# Patient Record
Sex: Female | Born: 1965
Health system: Southern US, Community
[De-identification: ages and names within clinical notes are randomized; demographics above are authoritative.]

## PROBLEM LIST (undated history)

## (undated) DIAGNOSIS — E119 Type 2 diabetes mellitus without complications: Secondary | ICD-10-CM

## (undated) DIAGNOSIS — J45909 Unspecified asthma, uncomplicated: Secondary | ICD-10-CM

## (undated) HISTORY — PX: COLONOSCOPY: SHX174

## (undated) HISTORY — PX: TONSILLECTOMY: SUR1361

## (undated) HISTORY — DX: Type 2 diabetes mellitus without complications: E11.9

## (undated) HISTORY — DX: Unspecified asthma, uncomplicated: J45.909

---

## 1999-04-17 ENCOUNTER — Other Ambulatory Visit: Admission: RE | Admit: 1999-04-17 | Discharge: 1999-04-17 | Payer: Self-pay | Admitting: Obstetrics and Gynecology

## 1999-04-23 ENCOUNTER — Other Ambulatory Visit: Admission: RE | Admit: 1999-04-23 | Discharge: 1999-04-23 | Payer: Self-pay | Admitting: Obstetrics and Gynecology

## 1999-04-23 ENCOUNTER — Encounter (INDEPENDENT_AMBULATORY_CARE_PROVIDER_SITE_OTHER): Payer: Self-pay

## 1999-06-11 ENCOUNTER — Encounter (INDEPENDENT_AMBULATORY_CARE_PROVIDER_SITE_OTHER): Payer: Self-pay | Admitting: Specialist

## 1999-06-11 ENCOUNTER — Ambulatory Visit (HOSPITAL_COMMUNITY): Admission: RE | Admit: 1999-06-11 | Discharge: 1999-06-11 | Payer: Self-pay | Admitting: Obstetrics and Gynecology

## 1999-06-11 HISTORY — PX: HYSTEROSCOPY WITH D & C: SHX1775

## 2000-10-07 ENCOUNTER — Other Ambulatory Visit: Admission: RE | Admit: 2000-10-07 | Discharge: 2000-10-07 | Payer: Self-pay | Admitting: Obstetrics and Gynecology

## 2001-06-08 ENCOUNTER — Encounter: Admission: RE | Admit: 2001-06-08 | Discharge: 2001-09-06 | Payer: Self-pay | Admitting: Family Medicine

## 2001-10-08 ENCOUNTER — Inpatient Hospital Stay (HOSPITAL_COMMUNITY): Admission: RE | Admit: 2001-10-08 | Discharge: 2001-10-10 | Payer: Self-pay | Admitting: Obstetrics and Gynecology

## 2001-10-08 ENCOUNTER — Encounter (INDEPENDENT_AMBULATORY_CARE_PROVIDER_SITE_OTHER): Payer: Self-pay | Admitting: *Deleted

## 2001-10-08 HISTORY — PX: TOTAL ABDOMINAL HYSTERECTOMY: SHX209

## 2003-10-13 ENCOUNTER — Other Ambulatory Visit: Admission: RE | Admit: 2003-10-13 | Discharge: 2003-10-13 | Payer: Self-pay | Admitting: *Deleted

## 2003-11-02 ENCOUNTER — Encounter: Admission: RE | Admit: 2003-11-02 | Discharge: 2003-11-02 | Payer: Self-pay | Admitting: Family Medicine

## 2005-01-23 ENCOUNTER — Ambulatory Visit (HOSPITAL_COMMUNITY): Admission: RE | Admit: 2005-01-23 | Discharge: 2005-01-23 | Payer: Self-pay | Admitting: *Deleted

## 2005-01-23 ENCOUNTER — Encounter (INDEPENDENT_AMBULATORY_CARE_PROVIDER_SITE_OTHER): Payer: Self-pay | Admitting: Specialist

## 2005-02-06 ENCOUNTER — Ambulatory Visit (HOSPITAL_COMMUNITY): Admission: RE | Admit: 2005-02-06 | Discharge: 2005-02-06 | Payer: Self-pay | Admitting: *Deleted

## 2005-02-06 ENCOUNTER — Encounter (INDEPENDENT_AMBULATORY_CARE_PROVIDER_SITE_OTHER): Payer: Self-pay | Admitting: Specialist

## 2006-11-18 ENCOUNTER — Other Ambulatory Visit: Admission: RE | Admit: 2006-11-18 | Discharge: 2006-11-18 | Payer: Self-pay | Admitting: Family Medicine

## 2008-08-17 ENCOUNTER — Other Ambulatory Visit: Admission: RE | Admit: 2008-08-17 | Discharge: 2008-08-17 | Payer: Self-pay | Admitting: Family Medicine

## 2008-09-05 ENCOUNTER — Ambulatory Visit (HOSPITAL_COMMUNITY): Admission: RE | Admit: 2008-09-05 | Discharge: 2008-09-05 | Payer: Self-pay | Admitting: Gastroenterology

## 2008-09-05 ENCOUNTER — Encounter (INDEPENDENT_AMBULATORY_CARE_PROVIDER_SITE_OTHER): Payer: Self-pay | Admitting: Gastroenterology

## 2010-01-21 ENCOUNTER — Other Ambulatory Visit: Admission: RE | Admit: 2010-01-21 | Discharge: 2010-01-21 | Payer: Self-pay | Admitting: Family Medicine

## 2010-09-03 NOTE — Op Note (Signed)
Priscilla Webb, Priscilla Webb               ACCOUNT NO.:  [id number]   MEDICAL RECORD NO.:  [id number]          PATIENT TYPE:  AMB   LOCATION:  ENDO                         FACILITY:  [facility]   PHYSICIAN:  Bernette Redbird, M.D.   DATE OF BIRTH:  Jan 24, 1966   DATE OF PROCEDURE:  09/05/2008  DATE OF DISCHARGE:                               OPERATIVE REPORT   PROCEDURE:  Colonoscopy with polypectomy and directed submucosal  injection.   INDICATION:  This is a 45 year old female who, around age 63, had a  massive sigmoid polyp resected by Dr. Roosvelt Harps.  The polyp was  benign, but adenomatous in character.  Somehow the patient was lost to  follow-up, but recently resurfaced at her primary physician's office  where she was noted to be Hemoccult positive and, in the meantime, her  father had been found to have colon cancer.   FINDINGS:  Large colon polyp removed piecemeal from the region of the  rectosigmoid junction.   PROCEDURE:  The risks of the procedure have been reviewed with the  patient.  She came as an outpatient to the [facility] endoscopy unit.  Sedation was fentanyl 100 mcg and Versed 10 mg IV to a level of mild  sedation.   The Pentax adult video colonoscope was easily advanced around the colon  to the cecum as identified by visualization of the appendiceal orifice  and pullback was then performed.  The quality of prep was excellent and  it was felt that all areas were well seen.   On the way in, strands of mucoid blood were noted in the rectum, and I  came upon a very large, approximately 5- to 6-cm diameter, fleshy mobile  nonulcerated benign-appearing polyp which almost occupied the colonic  lumen.  It was on a fairly long, discrete stalk.  The stalk was injected  with several mLs of 1:10,000 epinephrine and the head of the polyp was  transected with the ERBE coagulator, using a large snare.  However,  after doing so, there was still a fair amount of residual polyp and,  indeed, I had to do approximately 4 piecemeal resections before it  appeared that all tissue had been successfully retrieved.  The polyp  tissue essentially filled up 1 of the pathology specimen jars.  There  was no bleeding from the polypectomy and although the cautery went  fairly deep, to the point where I could see some muscle fibers, there  was no evidence of perforation and the patient remained comfortable  throughout the procedure.  I then used the sclero needle to tattoo the  polyp neck at the polypectomy site on either side, and this was  successfully accomplished.   The remainder of the colonoscopy was normal.  No other polyps were seen  and there was no evidence of cancer, colitis, vascular ectasia or  diverticulosis.  Retroflexion:  The rectum was normal.   The patient tolerated the procedure well and there were no apparent  complications.   IMPRESSION:  Large, roughly 6-cm, pedunculated polyp at 20 cm, similar  in location to previous removal of a  massive colonic polyp about 4 years  ago, suggesting the possibility of local recurrence.  Successful  piecemeal removal with subsequent tattooing.   PLAN:  Await pathology on the polyp.  Sigmoidoscopic follow-up in about  3 months to confirm adequacy of excision.  Consider genetic testing.           ______________________________  Bernette Redbird, M.D.     RB/MEDQ  D:  09/05/2008  T:  09/05/2008  Job:  [id number]   cc:   Clovis Riley, FNP  [facility]  [facility]

## 2010-09-06 NOTE — Op Note (Signed)
[facility]  Patient:    Priscilla Webb, Priscilla Webb Visit Number: [id number] MRN: [id number]          Service Type: GYN Location: 4W 0472 01 Attending Physician:  Sharon Mt Dictated by:   Rande Brunt. Eda Paschal, M.D. Proc. Date: 10/08/01 Admit Date:  10/08/2001                             Operative Report  PREOPERATIVE DIAGNOSIS:  Refractory dysfunctional uterine bleeding.  POSTOPERATIVE DIAGNOSIS:  Refractory dysfunctional uterine bleeding.  OPERATION:  Total abdominal hysterectomy.  SURGEON:  Daniel L. Eda Paschal, M.D.  FIRST ASSISTANT:  Juan H. Lily Peer, M.D.  ANESTHESIA:  General endotracheal.  FINDINGS:  At the time of surgery, the patient had a boggy, slightly enlarged uterus.  Ovaries, fallopian tubes, and pelvic peritoneum were free of any disease.  DESCRIPTION OF PROCEDURE:  After adequate general endotracheal anesthesia, the patient was placed in a supine position and prepped and draped in the usual sterile manner.  A Foley catheter was inserted into the patients bladder.  A transverse incision was made.  It was made above her panniculus in order to prevent skin infection.  It was extended through the fascia transversely into the peritoneum vertically.  Subcutaneous tissue bleeders were clamped and Bovied as encountered.  When the peritoneal cavity was opened, in spite of an adequate skin incision, because of the patients obesity, it was not really possible to have enough visualization to do the surgery.  Therefore, the rectus muscles were separated with the Bovie.  The were not completely separated laterally, but they were separated enough medially to give enough retraction.  All bleeding was controlled.  At this point, retractor could be placed.  The pelvis was inspected.  The round ligaments were Bovied and cut. The vesicouterine fold to peritoneum and posterior peritoneum was sharply dissected free.  The uteroovarian ligaments and  fallopian tubes were clamped, cut, and doubly suture ligated with #1 chromic catgut, leaving both ovaries in situ.  The bladder flap was advanced by sharp dissection.  The uterine arteries were clamped, cut, and doubly suture ligated with #1 chromic catgut. The cervicovaginal junction was identified, and then the parametrium was taken down in successive bites by clamping, cutting, and suture ligating with #1 chromic catgut.  At this point, the vagina was entered, and the uterus was sent to pathology for tissue diagnosis.  Angle sutures were placed in the angles of the vagina incorporating cardinal ligaments and uterosacral ligaments for good vault support, and then the cuff was closed with figure-of-eights of 0 Vicryl.  Copious irrigation was done with Ringers Lactate.  Two sponge, needle, instrument counts were correct, and then the fascia was closed with two running 0 PDSs.  Scarpas fascia was brought together with interrupteds of 2-0 plain, and the skin was closed with staples. Estimated blood loss for the entire procedure was 550 cc with none replaced. The patient tolerated the procedure well and left the operating room in satisfactory condition draining clear urine from her Foley catheter. Dictated by:   Rande Brunt. Eda Paschal, M.D. Attending Physician:  Sharon Mt DD:  10/08/00 TD:  10/09/01 Job: [id number]

## 2010-09-06 NOTE — Op Note (Signed)
[facility]  Patient:    Priscilla Webb, Priscilla Webb                      MRN: [id number] Proc. Date: 06/11/99 Adm. Date:  [id number] Attending:  Sharon Mt                           Operative Report  PREOPERATIVE DIAGNOSIS:       Menometrorrhagia with intrauterine defect.  POSTOPERATIVE DIAGNOSIS:      Menometrorrhagia with intrauterine defect.  Small  endometrial polyp.  OPERATION:                    Hysteroscopy, excision of endometrial polyp, D&C.  SURGEON:                      Daniel L. Eda Paschal, M.D.  ASSISTANT:  ANESTHESIA:                   General anesthesia.  FINDINGS:                     External and vaginal examination is within normal  limits.  Cervix is clean.  Uterus is midposition, normal size and shape without  descensus.  Adnexa are palpably normal.  At the time of hysteroscopy, the patient had a small endometrial polyp coming off the posterior wall of the fundus.  It as approximately 10 x 6 mm.  After this was removed, top of the fundus, tubal ostia, anterior and posterior walls of the fundus, lower uterine segment, and endocervical canal could all be well visualized and all were normal.  ESTIMATED BLOOD LOSS:  DESCRIPTION OF PROCEDURE:     After adequate general endotracheal anesthesia, the patient was placed in the dorsal lithotomy position and prepped and draped in the usual sterile fashion.  A single tooth tenaculum was placed on the anterior lip of the cervix and then the cervix was dilated to a #35 Pratt dilator. Hysteroscopic examination was done with the hysteroscopic resectoscope.  3% Sorbitol was used to expand the intrauterine cavity.  A camera was used for magnification.  A 90 degree wire loupe was attached to the hysteroscope.  Bovie settings were 70 coag, 110 cutting, blend I.  The intrauterine cavity was well visualized and was as noted  above.  The endometrial polyp was excised with the  resectoscope without any bleeding whatsoever.  Following this other than a significant buildup of endometrium, the intrauterine cavity was normal.  A sharp curettage was done with two different size curets with significant curettings being removed.  They were  sent to pathology as was the polyp.  At the termination of the procedure, there was no significant bleeding.  Estimated blood loss for the entire procedure was less than 50 cc with none replaced.  Fluid deficit was under 100 cc.  The patient left the operating room in satisfactory condition. DD:  06/11/99 TD:  06/11/99 Job: [id number]

## 2010-09-06 NOTE — Discharge Summary (Signed)
[facility]  Patient:    Priscilla Webb, Priscilla Webb Visit Number: [id number] MRN: [id number]          Service Type: GYN Location: 4W 0472 01 Attending Physician:  Sharon Mt Dictated by:   Rande Brunt. Eda Paschal, M.D. Admit Date:  10/08/2001 Discharge Date: 10/10/2001                             Discharge Summary  HISTORY OF PRESENT ILLNESS:  The patient is a 45 year old female who was admitted to the hospital with refractory DUB and polycystic ovarian syndrome for definitive surgery.  HOSPITAL COURSE:  On the day of admission she was taken to the operating room and a total abdominal hysterectomy was performed.  Postoperatively, she did well, and by the second postoperative day she was ready for discharge.  DISCHARGE MEDICATIONS:  Tylox p.r.n.  DIET:  Regular.  ACTIVITY:  Ambulatory.  FOLLOWUP:  She will return to the office in two days for staple removal.  Final pathology report is not available at time of dictation.  DISCHARGE DIAGNOSES: 1. Refractory DUB. 2. Polycystic ovarian syndrome.  OPERATION:  Total abdominal hysterectomy.  CONDITION ON DISCHARGE:  Improved. Dictated by:   Rande Brunt. Eda Paschal, M.D. Attending Physician:  Sharon Mt DD:  10/10/01 TD:  10/11/01 Job: [id number]

## 2010-09-06 NOTE — H&P (Signed)
[facility]  Patient:    Priscilla Webb, Priscilla Webb Visit Number: [id number] MRN: [id number]          Service Type: GYN Location: 4W 0472 01 Attending Physician:  Sharon Mt Dictated by:   Rande Brunt. Eda Paschal, M.D. Admit Date:  10/08/2001 Discharge Date: 10/10/2001                           History and Physical  CHIEF COMPLAINT:  Menometrorrhagia not responsive to other therapy.  HISTORY OF PRESENT ILLNESS:  The patient is a 45 year old nulligravida who has had a long history of menometrorrhagia associated with polycystic ovarian syndrome.  At one point, she had an endometrial polyp.  Hysteroscopy and D&C were done with removal of the polyp, but the menometrorrhagia has persisted. It has been associated with anemia.  The patient has tried ovulation induction in the past without success in terms of conceiving.  She has been on a variety of oral contraceptives, including 50 mcg estrogen, without any resolution of the above.  She has tried Megace with any resolution of the above.  Although at one point she was interested in having a family, this persistent menometrorrhagia with anemia has taken its toll in her life and she really now wants to resolve it.  She understands that she will not be able to conceive after hysterectomy, but would like to proceed with total abdominal hysterectomy.  She has given me permission to remove one or both ovaries for significant disease.  PAST MEDICAL HISTORY: 1. T&A in 1984. 2. D&C and hysteroscopy in 2001.  PRESENT MEDICATIONS:  Megace.  ALLERGIES:  PENICILLIN.  FAMILY HISTORY:  Her grandmother is diabetic.  Her father is hypertensive. Her father also has had coronary artery disease.  Her mother grandmother has had breast cancer.  Her paternal grandmother also had non-Hodgkins lymphoma.  SOCIAL HISTORY:  She is a nonsmoker and nondrinker.  She does have two cups of caffeine per day.  REVIEW OF SYSTEMS:   HEENT:  Negative.  Cardiac:  Negative.  GI:  Negative. GU:  Negative.  Neuromuscular:  Negative.  Endocrine:  Negative.  Allergic, Immunologic, Lymphatic, and Endocrine:  Negative.  PHYSICAL EXAMINATION:  The patient is a well-developed, well-nourished female in no acute distress.  VITAL SIGNS:  Her blood pressure is 120/88, pulse 80 and regular, and respirations 16 and nonlabored.  She is afebrile.  HEENT:  Within normal limits.  NECK:  Supple.  Trachea in the midline.  The thyroid is not enlarged.  LUNGS:  Clear to P&A.  HEART:  No thrills, heaves, or murmurs.  BREASTS:  No masses.  ABDOMEN:  Soft without guarding, rebound, or masses.  PELVIC:  External and vaginal is within normal limits.  The cervix is clear. The Pap smear shows no atypia.  The uterus is normal in size and shape. Adnexa are palpably normal.  Rectovaginal is confirmatory.  ADMISSION IMPRESSION:  Refractory dysfunctional uterine bleeding.  PLAN:  TAH. Dictated by:   Rande Brunt. Eda Paschal, M.D. Attending Physician:  Sharon Mt DD:  10/07/01 TD:  10/08/01 Job: [id number]

## 2011-03-20 ENCOUNTER — Other Ambulatory Visit: Payer: Self-pay | Admitting: Family Medicine

## 2011-03-20 DIAGNOSIS — E049 Nontoxic goiter, unspecified: Secondary | ICD-10-CM

## 2011-03-24 ENCOUNTER — Ambulatory Visit
Admission: RE | Admit: 2011-03-24 | Discharge: 2011-03-24 | Disposition: A | Discharge status: Home / Self Care | Payer: 59 | Source: Ambulatory Visit | Attending: Family Medicine | Admitting: Family Medicine

## 2011-03-24 DIAGNOSIS — E049 Nontoxic goiter, unspecified: Secondary | ICD-10-CM

## 2011-08-25 ENCOUNTER — Encounter: Payer: Self-pay | Admitting: *Deleted

## 2011-08-25 ENCOUNTER — Encounter: Payer: 59 | Attending: Family Medicine | Admitting: *Deleted

## 2011-08-25 DIAGNOSIS — E663 Overweight: Secondary | ICD-10-CM | POA: Insufficient documentation

## 2011-08-25 DIAGNOSIS — Z713 Dietary counseling and surveillance: Secondary | ICD-10-CM | POA: Insufficient documentation

## 2011-08-25 NOTE — Patient Instructions (Addendum)
Plan: Aim for 3 Carb Choices per meal (45 grams) +/- 1 either way, 0-1 per snack if hungry Ask MD about possibility of using Invokana, new DM medication associated with weight loss Consider choosing more types of fats from plant sources, fewer from animal sources which are saturated Continue with water activities 2-4 times a week Read food labels for Total Carb and Fat grams

## 2011-08-25 NOTE — Progress Notes (Signed)
  Medical Nutrition Therapy:  Appt start time: 1100 end time:  1200.  Assessment:  Primary concerns today: patient here for more information on PCOS and assistance with weight loss to improve insulin resistance. She lives with her husband and her parents as her Dad has cancer so she can assist with his care. She works at [employer] on weekend shift 7AM - 7 PM and one day during week every other week.    MEDICATIONS: see list, no Rx meds at this time   DIETARY INTAKE:  Usual eating pattern includes 2-3 meals and 3 snacks per day.  Everyday foods include good variety of all food groups.  Avoided foods include none stated.    24-hr recall:  B ( AM): skips 1-2 days a week, banana, OR left overs in refrig, OR cereal with 2% milk  Snk ( AM): left overs occasionally  L ( PM): left overs from home OR at home, fruit juice with Splenda Snk ( PM): 6 pk PNB crackers, water or coffee black D ( PM): Dad prepares: burgers, pasta, or meat - starch - veg meal, water or regular soda 12 oz Snk ( PM): occasionally - cake at [employer] OR ice cream pop once a week Beverages: coffee, milk, regular soda, SF fruit juice  Usual physical activity: gym twice a week [employer], water Zumba, occasionally treadmill or weight training  Estimated energy needs: 1500 calories 170 g carbohydrates 112 g protein 42 g fat  Progress Towards Goal(s):  In progress.   Nutritional Diagnosis:  NI-1.5 Excessive energy intake As related to activity level.  As evidenced by BMI of 53.1% .    Intervention:  Nutrition counseling provided in addition to pre-diabetes information. concentrated on carb counting with assessment of fat intake as well. Taught reading food labels and encouraged her to continue with recent increase in activity level along with her husband Plan: Aim for 3 Carb Choices per meal (45 grams) +/- 1 either way, 0-1 per snack if hungry Ask MD about possibility of using Invokana, new DM medication associated  with weight loss Consider choosing more types of fats from plant sources, fewer from animal sources which are saturated Continue with water activities 2-4 times a week Read food labels for Total Carb and Fat grams   Handouts given during visit include: Living Well with Diabetes Carb Counting and Food Label handouts Meal Plan Card PCOS handout from [employer]  Monitoring/Evaluation:  Dietary intake, exercise, reading food labels, and body weight prn.

## 2011-08-26 ENCOUNTER — Other Ambulatory Visit: Payer: Self-pay | Admitting: Family Medicine

## 2011-08-26 ENCOUNTER — Other Ambulatory Visit (HOSPITAL_COMMUNITY)
Admission: RE | Admit: 2011-08-26 | Discharge: 2011-08-26 | Disposition: A | Discharge status: Home / Self Care | Payer: 59 | Source: Ambulatory Visit | Attending: Family Medicine | Admitting: Family Medicine

## 2011-08-26 DIAGNOSIS — Z124 Encounter for screening for malignant neoplasm of cervix: Secondary | ICD-10-CM | POA: Insufficient documentation

## 2011-08-26 DIAGNOSIS — Z1159 Encounter for screening for other viral diseases: Secondary | ICD-10-CM | POA: Insufficient documentation

## 2011-11-18 ENCOUNTER — Other Ambulatory Visit: Payer: Self-pay | Admitting: Family Medicine

## 2011-11-18 DIAGNOSIS — R10819 Abdominal tenderness, unspecified site: Secondary | ICD-10-CM

## 2011-11-24 ENCOUNTER — Other Ambulatory Visit: Payer: Self-pay | Admitting: Family Medicine

## 2011-11-24 ENCOUNTER — Other Ambulatory Visit: Payer: 59

## 2011-11-24 DIAGNOSIS — R109 Unspecified abdominal pain: Secondary | ICD-10-CM

## 2011-11-25 ENCOUNTER — Other Ambulatory Visit: Payer: 59

## 2011-11-25 ENCOUNTER — Ambulatory Visit
Admission: RE | Admit: 2011-11-25 | Discharge: 2011-11-25 | Disposition: A | Discharge status: Home / Self Care | Payer: 59 | Source: Ambulatory Visit | Attending: Family Medicine | Admitting: Family Medicine

## 2011-11-25 DIAGNOSIS — R109 Unspecified abdominal pain: Secondary | ICD-10-CM

## 2011-11-29 ENCOUNTER — Encounter (HOSPITAL_COMMUNITY): Payer: Self-pay | Admitting: *Deleted

## 2011-11-29 ENCOUNTER — Emergency Department (HOSPITAL_COMMUNITY)
Admission: EM | Admit: 2011-11-29 | Discharge: 2011-11-29 | Disposition: A | Discharge status: Home / Self Care | Payer: 59 | Attending: Emergency Medicine | Admitting: Emergency Medicine

## 2011-11-29 DIAGNOSIS — R109 Unspecified abdominal pain: Secondary | ICD-10-CM | POA: Insufficient documentation

## 2011-11-29 LAB — URINALYSIS, ROUTINE W REFLEX MICROSCOPIC
Ketones, ur: NEGATIVE mg/dL
Nitrite: NEGATIVE
Protein, ur: NEGATIVE mg/dL
Urobilinogen, UA: 0.2 mg/dL (ref 0.0–1.0)

## 2011-11-29 LAB — POCT I-STAT, CHEM 8
BUN: 13 mg/dL (ref 6–23)
Calcium, Ion: 1.18 mmol/L (ref 1.12–1.23)
Chloride: 105 mEq/L (ref 96–112)
Hemoglobin: 15 g/dL (ref 12.0–15.0)

## 2011-11-29 LAB — COMPREHENSIVE METABOLIC PANEL
ALT: 26 U/L (ref 0–35)
BUN: 13 mg/dL (ref 6–23)
CO2: 28 mEq/L (ref 19–32)
Creatinine, Ser: 0.65 mg/dL (ref 0.50–1.10)
GFR calc Af Amer: >90 mL/min (ref >90)
GFR calc non Af Amer: >90 mL/min (ref >90)

## 2011-11-29 LAB — CBC
Hemoglobin: 14.4 g/dL (ref 12.0–15.0)
MCV: 90.1 fL (ref 78.0–100.0)
Platelets: 273 10*3/uL (ref 150–400)

## 2011-11-29 MED ORDER — HYDROCODONE-ACETAMINOPHEN 5-325 MG PO TABS: ORAL_TABLET | ORAL | Status: AC

## 2011-11-29 NOTE — ED Notes (Signed)
Pt states that she has been having right side pain close to the umbilicus. Pt saw PCP on Tuesday and had an ultrasound done. Pt denies problems with urination or bowel or discharge. Pt states she feels like she has been having increased gas.

## 2011-11-29 NOTE — ED Notes (Signed)
Patient states she has kidney stones, she states she thinks that the pain was more anxiety.

## 2011-11-29 NOTE — ED Provider Notes (Signed)
History     CSN: [id number]  Arrival date & time 11/29/11  0146   First MD Initiated Contact with Patient 11/29/11 [phone]      Chief Complaint  Patient presents with  . Flank Pain    (Consider location/radiation/quality/duration/timing/severity/associated sxs/prior treatment) HPI Comments: Patient with history of abdominal hysterectomy -- presents with two-week history of intermittent right lower abdominal pain in the area of a knot that she can feel. Patient saw her primary care physician for this this week and had abdominal ultrasound performed which showed fatty infiltration of the liver but no other acute findings. Patient states that the pain is sharp and fleeting. It does not radiate. It is made worse with palpation of the area. Certain positions make it worse and certain positions make it better. She denies fever, nausea or vomiting. She states that she has been mildly constipated since the pain began however she has not had any blood in her stool. No urinary symptoms. Onset was acute. Course is intermittent. No other abdominal surgeries.   Patient is a 46 y.o. female presenting with abdominal pain. The history is provided by the patient.  Abdominal Pain The primary symptoms of the illness include abdominal pain. The primary symptoms of the illness do not include fever, shortness of breath, nausea, vomiting, diarrhea, dysuria, vaginal discharge or vaginal bleeding. The current episode started more than 2 days ago. The onset of the illness was sudden. The problem has not changed since onset. Additional symptoms associated with the illness include constipation.    History reviewed. No pertinent past medical history.  Past Surgical History  Procedure Date  . Abdominal hysterectomy   . Tonsillectomy     History reviewed. No pertinent family history.  History  Substance Use Topics  . Smoking status: Never Smoker   . Smokeless tobacco: Never Used  . Alcohol Use: No     wine once  a month or so    OB History    Grav Para Term Preterm Abortions TAB SAB Ect Mult Living                  Review of Systems  Constitutional: Negative for fever.  HENT: Negative for sore throat and rhinorrhea.   Eyes: Negative for redness.  Respiratory: Negative for cough and shortness of breath.   Cardiovascular: Negative for chest pain.  Gastrointestinal: Positive for abdominal pain and constipation. Negative for nausea, vomiting, diarrhea and blood in stool.  Genitourinary: Negative for dysuria, vaginal bleeding and vaginal discharge.  Musculoskeletal: Negative for myalgias.  Skin: Negative for rash.  Neurological: Negative for headaches.    Allergies  Bee venom; Doxycycline; and Penicillins  Home Medications   Current Outpatient Rx  Name Route Sig Dispense Refill  . IBUPROFEN 100 MG PO TABS Oral Take 100 mg by mouth every 6 (six) hours as needed.    . MULTI-VITAMIN/MINERALS PO TABS Oral Take 1 tablet by mouth daily.      BP 183/109  Pulse 110  Temp 98.8 F (37.1 C) (Oral)  Resp 16  SpO2 96%  Physical Exam  Nursing note and vitals reviewed. Constitutional: She appears well-developed and well-nourished.  HENT:  Head: Normocephalic and atraumatic.  Eyes: Conjunctivae are normal. Right eye exhibits no discharge. Left eye exhibits no discharge.  Neck: Normal range of motion. Neck supple.  Cardiovascular: Normal rate, regular rhythm and normal heart sounds.        No tachycardia on my exam.   Pulmonary/Chest: Effort normal and breath  sounds normal.  Abdominal: Soft. Bowel sounds are normal. There is tenderness. There is no rebound and no guarding.       Obese abdomen. Localized tenderness over small firm area of abdominal wall, R lateral abdomen in fat crease. No skin changes.   Neurological: She is alert.  Skin: Skin is warm and dry.  Psychiatric: She has a normal mood and affect.    ED Course  Procedures (including critical care time)  Labs Reviewed    COMPREHENSIVE METABOLIC PANEL - Abnormal; Notable for the following:    Glucose, Bld 144 (*)     All other components within normal limits  POCT I-STAT, CHEM 8 - Abnormal; Notable for the following:    Glucose, Bld 140 (*)     All other components within normal limits  CBC  URINALYSIS, ROUTINE W REFLEX MICROSCOPIC  LAB REPORT - SCANNED   No results found.   1. Abdominal pain     3:00 AM Patient seen and examined. Work-up initiated. Patient does not want any pain medications.   Vital signs reviewed and are as follows: Filed Vitals:   11/29/11 0154  BP: 183/109  Pulse: 110  Temp: 98.8 F (37.1 C)  Resp: 16   Work-up completed and is reassuring. Patient is reassured. She will follow-up with Dr. Zachery Dauer for continued evaluation and management.   The patient was urged to return to the Emergency Department immediately with worsening of current symptoms, worsening abdominal pain, persistent vomiting, blood noted in stools, fever, or any other concerns. The patient verbalized understanding.   Patient counseled on use of narcotic pain medications. Counseled not to combine these medications with others containing tylenol. Urged not to drink alcohol, drive, or perform any other activities that requires focus while taking these medications. The patient verbalizes understanding and agrees with the plan.  BP 164/109  Pulse 110  Temp 98.8 F (37.1 C) (Oral)  Resp 17  SpO2 96%  MDM  Patient with abdominal pain over 'knot' in abdominal wall. Work-up reassuring. Recent abd [place] reviewed and negative except for fatty liver. I am uncertain as of etiology of knot. Do not suspect abscess or hernia. Possible scar tissue with irregular bordered. Do not think it is a lipoma. Regardless, patient appears well. No other symptoms including fever, N/V/D, constipation. She is appropriate for follow-up with PCP with symptoms control in meantime.   HTN noted -- no obvious end-organ damage.   Mild  tachycardia -- do not suspect sepsis or that this represents SIRS.       Renne Crigler, [place] 12/01/11 2017

## 2011-12-05 NOTE — ED Provider Notes (Signed)
Medical screening examination/treatment/procedure(s) were performed by non-physician practitioner and as supervising physician I was immediately available for consultation/collaboration.  Sunnie Nielsen, MD 12/05/11 [phone]

## 2012-03-31 ENCOUNTER — Encounter (INDEPENDENT_AMBULATORY_CARE_PROVIDER_SITE_OTHER): Payer: Self-pay | Admitting: General Surgery

## 2012-03-31 ENCOUNTER — Ambulatory Visit (INDEPENDENT_AMBULATORY_CARE_PROVIDER_SITE_OTHER): Payer: Commercial Managed Care - PPO | Admitting: General Surgery

## 2012-03-31 VITALS — BP 132/70 | HR 76 | Temp 97.0°F | Resp 12 | Ht 60.5 in | Wt 281.6 lb

## 2012-03-31 DIAGNOSIS — L729 Follicular cyst of the skin and subcutaneous tissue, unspecified: Secondary | ICD-10-CM

## 2012-03-31 DIAGNOSIS — L723 Sebaceous cyst: Secondary | ICD-10-CM

## 2012-03-31 NOTE — Progress Notes (Signed)
Patient ID: Priscilla Webb, female   DOB: 03/27/1966, 46 y.o.   MRN: [id number]  Chief Complaint  Patient presents with  . New Evaluation    eval (2) abd wall abscess - RLQ - draining since 9/13    HPI Priscilla Webb is a 46 y.o. female.   HPI 46 yo WF referred by Dr Zachery Dauer for evaluation of persistent abdominal wall abscess/cysts. The patient states that she was in her usual state of health until this past June when she noticed some swelling on her right lower abdominal wall. She states that it got to the size of a softball. She states that it is red, swollen, and tender. It was initially thought to be a hernia. But later found to be an abscess. She underwent incision and drainage by her primary care physician. She was placed on an antibiotic course for this. She states that a month or 2 later she noticed a similar lesion on her left lower abdominal wall but not the same size or degree. It started spontaneously draining clear fluid. She states that the lesion on the right when it was lanced drained mainly bloody clear fluid. She denies any fevers or chills. She has been off and on Keflex several times. She denies any weight loss. She denies any trauma to the area. She has had a prior hysterectomy many years ago. She denies any other lumps or bumps. The one on the right is no longer draining any fluid. The one on the left occasionally will drain a little bit of clear fluid. Past Medical History  Diagnosis Date  . Asthma     Past Surgical History  Procedure Date  . Abdominal hysterectomy   . Tonsillectomy     Family History  Problem Relation Age of Onset  . Cancer Father     colon  . Cancer Maternal Grandmother     breast  . Cancer Paternal Grandmother     non hodgkins lymphoma    Social History History  Substance Use Topics  . Smoking status: Never Smoker   . Smokeless tobacco: Never Used  . Alcohol Use: No     Comment: wine once a month or so    Allergies  Allergen  Reactions  . Bee Venom   . Doxycycline Nausea And Vomiting  . Penicillins Rash    Current Outpatient Prescriptions  Medication Sig Dispense Refill  . cephALEXin (KEFLEX) 500 MG capsule Take 500 mg by mouth 3 (three) times daily.      Marland Kitchen ibuprofen (ADVIL,MOTRIN) 200 MG tablet Take 200-400 mg by mouth every 6 (six) hours as needed. headache      . Multiple Vitamin (MULTIVITAMIN WITH MINERALS) TABS Take 1 tablet by mouth daily.        Review of Systems Review of Systems  Constitutional: Negative for fever, activity change, appetite change and unexpected weight change.  HENT: Negative for hearing loss and neck pain.   Eyes: Negative for photophobia and visual disturbance.  Respiratory: Negative for chest tightness, shortness of breath and wheezing.   Cardiovascular: Negative for chest pain, palpitations and leg swelling.  Gastrointestinal: Negative for nausea, vomiting, diarrhea, constipation and blood in stool.  Genitourinary: Negative for dysuria and difficulty urinating.  Skin: Positive for wound. Negative for pallor and rash.  Neurological: Negative for seizures, light-headedness and numbness.  Hematological: Negative for adenopathy. Does not bruise/bleed easily.  Psychiatric/Behavioral: Negative for behavioral problems and confusion.    Blood pressure 132/70, pulse 76, temperature 97 F (  36.1 C), temperature source Temporal, resp. rate 12, height 5' 0.5" (1.537 m), weight 281 lb 9.6 oz (127.733 kg).  Physical Exam Physical Exam  Vitals reviewed. Constitutional: She is oriented to person, place, and time. She appears well-developed and well-nourished. No distress.       Morbidly obese  HENT:  Head: Normocephalic and atraumatic.  Right Ear: External ear normal.  Left Ear: External ear normal.  Eyes: Conjunctivae normal are normal. No scleral icterus.  Neck: Normal range of motion. Neck supple. No tracheal deviation present.  Cardiovascular: Normal rate, regular rhythm and  normal heart sounds.   Pulmonary/Chest: Effort normal and breath sounds normal. No respiratory distress. She has no wheezes.  Abdominal: Soft. Bowel sounds are normal.       Obese abdomen, mid-abdominal pannus. The 2 lesions are in fold of pannus. 1 - Rt side. Old incision almost healed. No cellulitis, induration, or fluctuance. No palpable mass. In vicinity there is some hyperpigmentation of skin; 2 - LLQ wound about 0.5 inch. No cellulitis, induration, or fluctuance  Neurological: She is alert and oriented to person, place, and time. She exhibits normal muscle tone.  Skin: Skin is warm and dry. She is not diaphoretic.  Psychiatric: She has a normal mood and affect. Her behavior is normal. Judgment and thought content normal.    Data Reviewed Dr Zachery Dauer note 12/4 ED note  Assessment    Abdominal wall cyst x 2    Plan    These appear to be healing abdominal wall abscesses. There does not appear to be any current signs of infection. There is no cellulitis, induration, or fluctuance. The etiology is probably due to friction between her skin folds in her pannus. This doesn't appear consistent with hydradenitis. I recommended observation for now. I do not believe she needs any additional antibiotics. I told her to contact the office should these areas flare up again  Priscilla Sella. Andrey Campanile, MD, FACS General, Bariatric, & Minimally Invasive Surgery [facility], [place]        [facility] 03/31/2012, 9:40 AM

## 2012-06-09 ENCOUNTER — Encounter (INDEPENDENT_AMBULATORY_CARE_PROVIDER_SITE_OTHER): Payer: Commercial Managed Care - PPO | Admitting: General Surgery

## 2013-08-29 ENCOUNTER — Other Ambulatory Visit: Payer: Self-pay | Admitting: Gastroenterology

## 2013-10-01 IMAGING — US US ABDOMEN COMPLETE
1 series · 14 of 25 positions shown · non-contrast
Comparison: None.

CLINICAL DATA: Right mid abdominal pain, obesity

ABDOMINAL ULTRASOUND COMPLETE

[Series 1: us abdomen complete · 0.41mm/px · 14 of 85 slices shown]
[im 1/85]
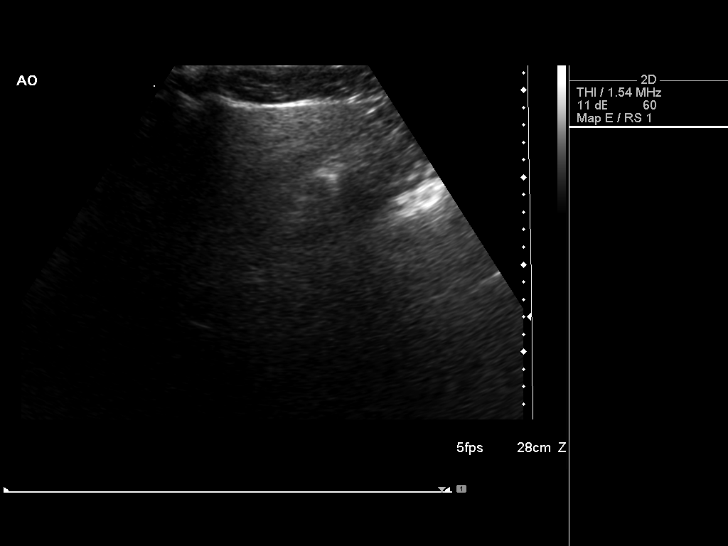
[im 8/85]
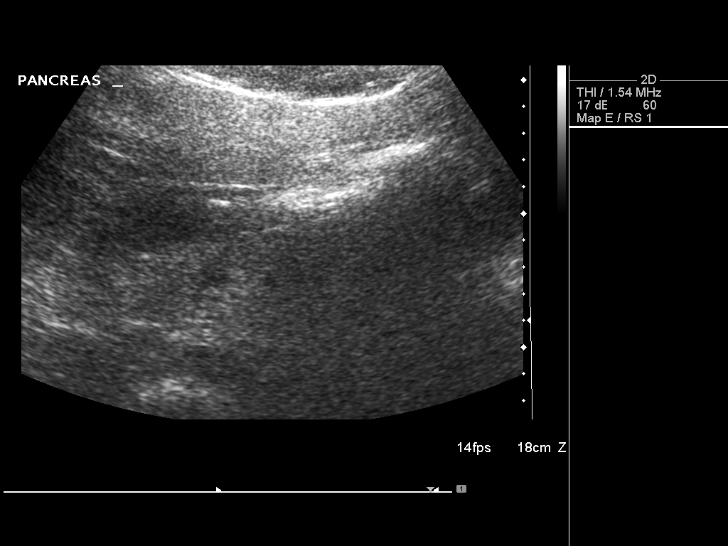
[im 15/85]
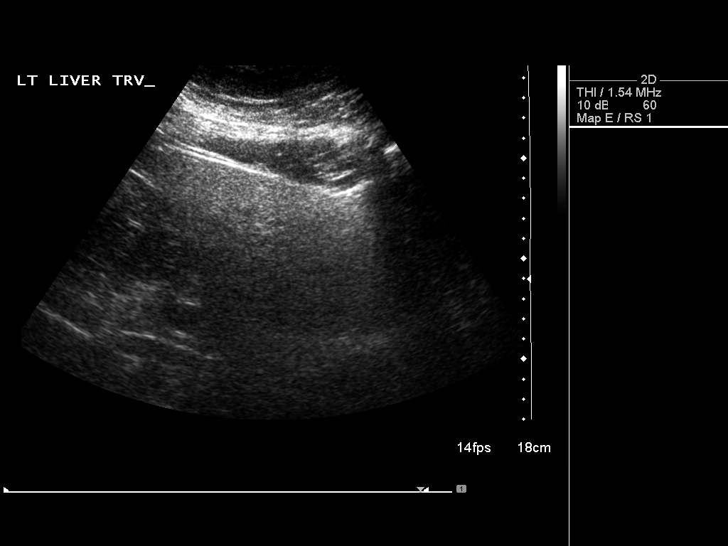
[im 22/85]
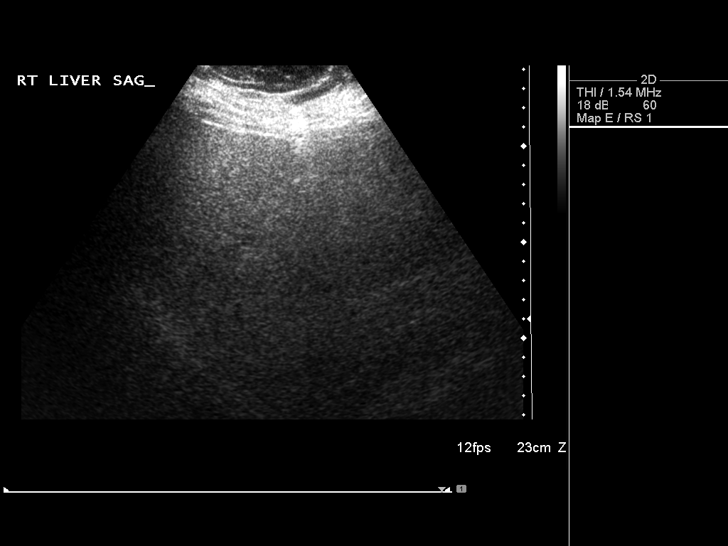
[im 29/85]
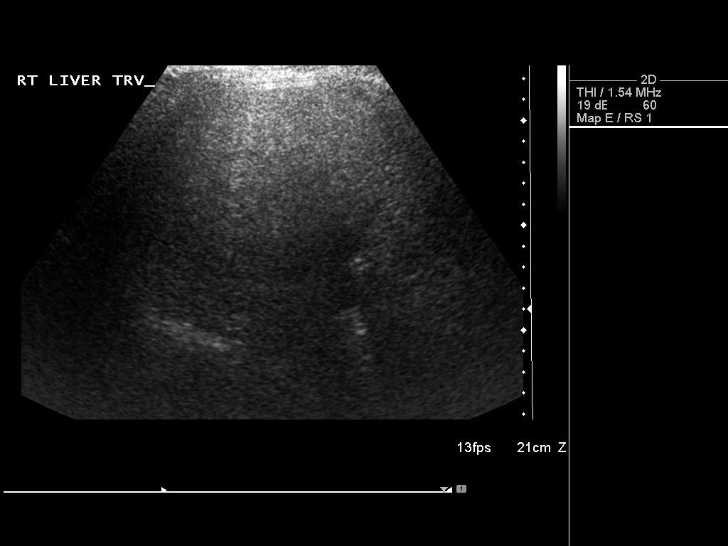
[im 32/85]
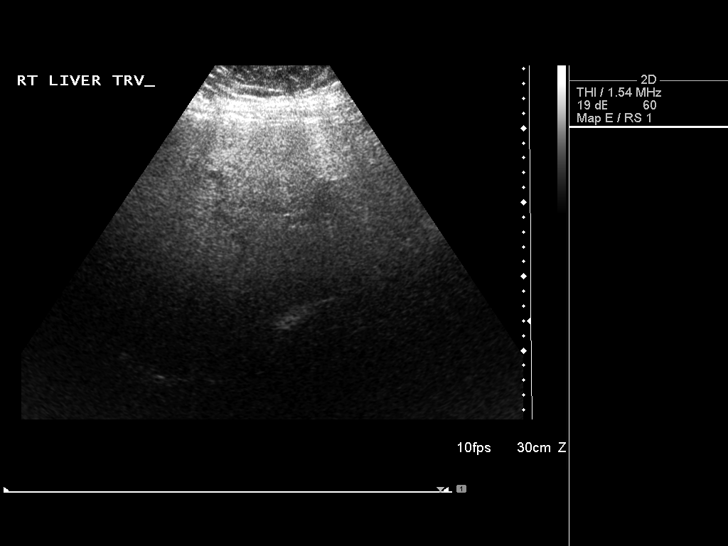
[im 39/85]
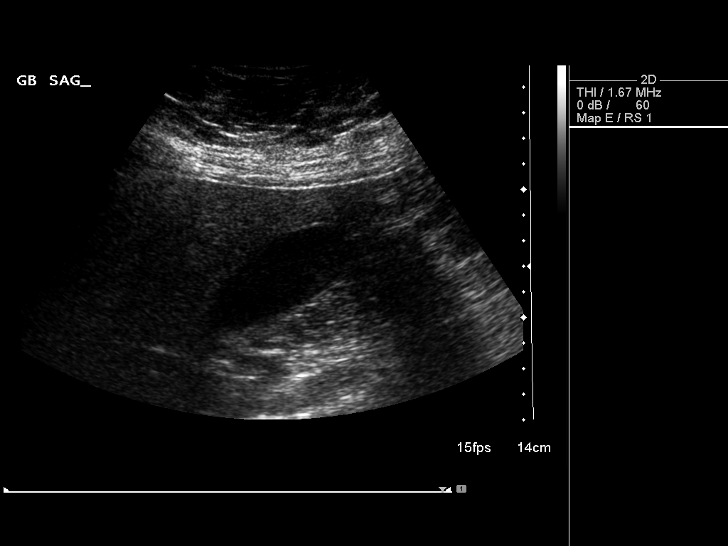
[im 46/85]
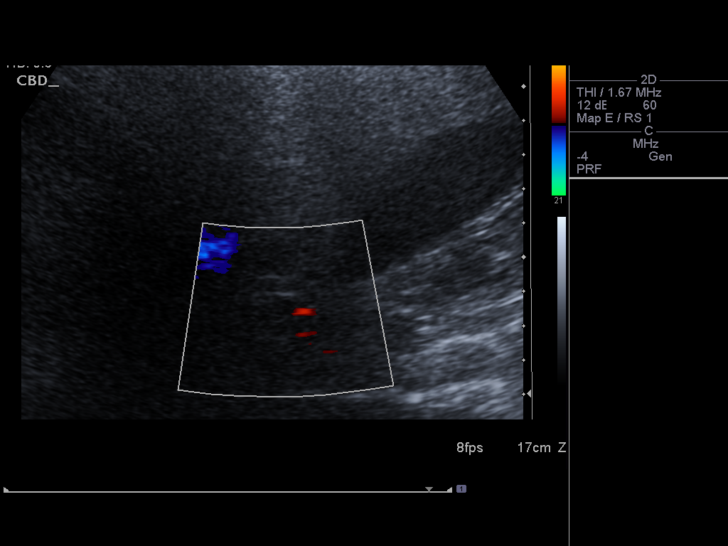
[im 53/85]
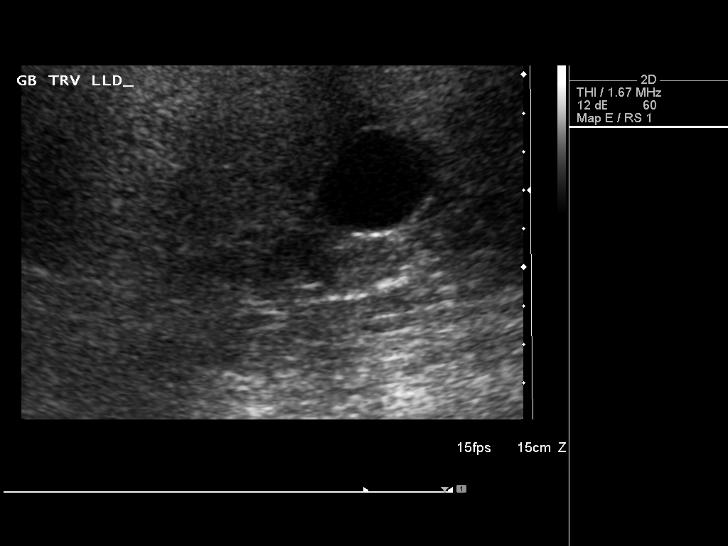
[im 57/85]
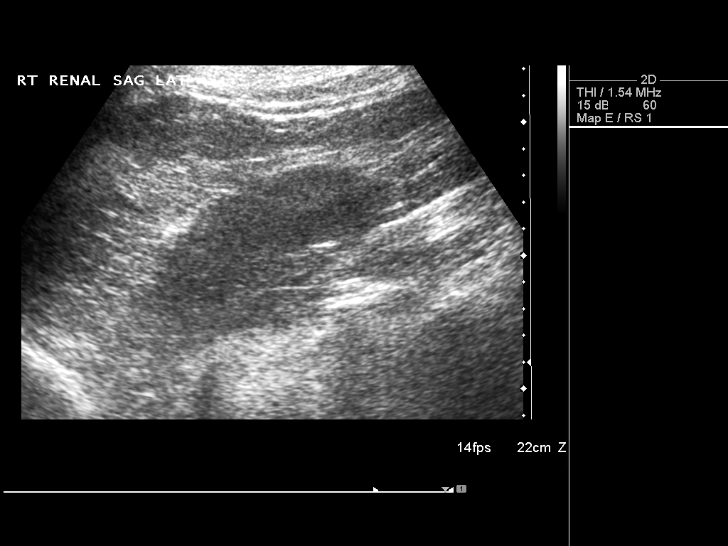
[im 64/85]
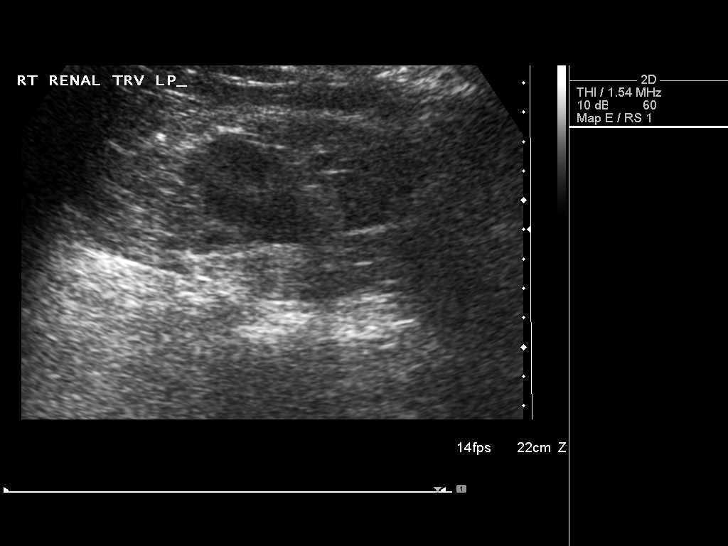
[im 71/85]
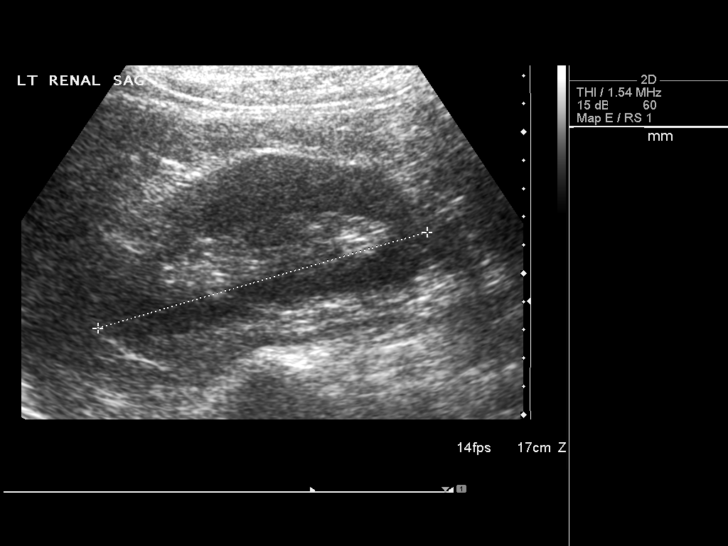
[im 78/85]
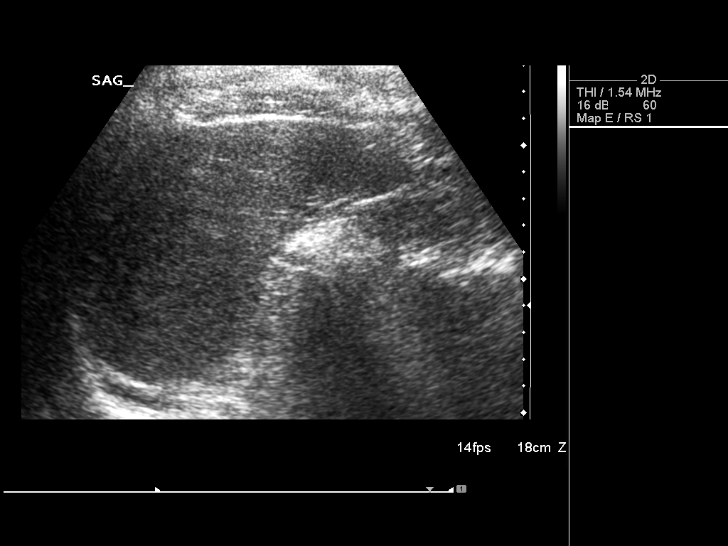
[im 85/85]
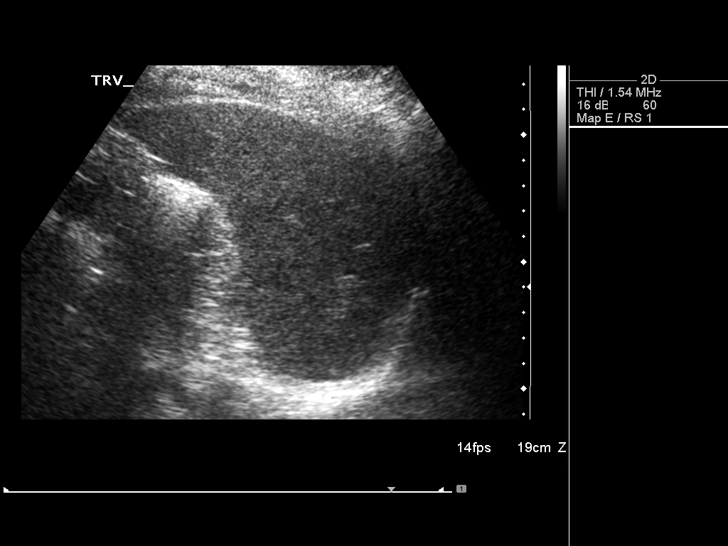

[14 of 25 positions shown; findings below may reference images not displayed]

FINDINGS: Gallbladder:  No gallstones, gallbladder wall thickening, or
pericholecystic fluid. Negative sonographic Murphy's sign.

Common Bile Duct:  Within normal limits in caliber. Measures 4 mm.

Liver: No focal mass lesion identified. There is pronounced fatty
infiltration.

IVC:  Appears normal.

Pancreas:  No abnormality identified.

Spleen: The upper limits of normal in size, measuring 11.5 cm in
length and 570 ml in volume. Within normal limits in echotexture.

Right kidney:  Normal in size and parenchymal echogenicity.  No
evidence of mass or hydronephrosis.

Left kidney:  Normal in size and parenchymal echogenicity.  No
evidence of mass or hydronephrosis.

Abdominal Aorta:  No aneurysm identified.
IMPRESSION: There is pronounced, diffuse fatty infiltration of the
liver.

## 2013-10-31 ENCOUNTER — Encounter (INDEPENDENT_AMBULATORY_CARE_PROVIDER_SITE_OTHER): Payer: Self-pay | Admitting: General Surgery

## 2013-10-31 ENCOUNTER — Ambulatory Visit (INDEPENDENT_AMBULATORY_CARE_PROVIDER_SITE_OTHER): Payer: Commercial Managed Care - PPO | Admitting: General Surgery

## 2013-10-31 VITALS — BP 130/74 | HR 77 | Temp 97.9°F | Ht 61.0 in | Wt 283.0 lb

## 2013-10-31 DIAGNOSIS — L02419 Cutaneous abscess of limb, unspecified: Secondary | ICD-10-CM

## 2013-10-31 DIAGNOSIS — L03119 Cellulitis of unspecified part of limb: Secondary | ICD-10-CM

## 2013-10-31 NOTE — Progress Notes (Signed)
Patient ID: Priscilla Webb, female   DOB: Mar 29, 1966, 48 y.o.   MRN: [id number]  Chief Complaint  Patient presents with  . eval cyst thigh    HPI Priscilla Webb is a 48 y.o. female.  Referred by Dr Yaakov Guthrie HPI This is a 48 year old female who presented in March 2 Dr. Sabra Heck with a cyst in the upper inner thigh.  At that point she had this area lanced. This expressed bloody stained cloudy purulent fluid. This was packed at that point. She was placed on antibiotics and then was followed. The area got better initially. For now this area keeps recurring and it drains occasionally. She denies any fevers ever. She is now started her first round of antibiotics after she saw Dr. Jacelyn Grip this morning. She complaints of continued drainage at this site but really has no other complaints.   Past Medical History  Diagnosis Date  . Asthma     Past Surgical History  Procedure Laterality Date  . Abdominal hysterectomy    . Tonsillectomy      Family History  Problem Relation Age of Onset  . Cancer Father     colon  . Cancer Maternal Grandmother     breast  . Cancer Paternal Grandmother     non hodgkins lymphoma    Social History History  Substance Use Topics  . Smoking status: Never Smoker   . Smokeless tobacco: Never Used  . Alcohol Use: No     Comment: wine once a month or so    Allergies  Allergen Reactions  . Bee Venom   . Doxycycline Nausea And Vomiting  . Penicillins Rash    Current Outpatient Prescriptions  Medication Sig Dispense Refill  . ibuprofen (ADVIL,MOTRIN) 200 MG tablet Take 200-400 mg by mouth every 6 (six) hours as needed. headache      . Multiple Vitamin (MULTIVITAMIN WITH MINERALS) TABS Take 1 tablet by mouth daily.      Marland Kitchen sulfamethoxazole-trimethoprim (BACTRIM DS) 800-160 MG per tablet Take 1 tablet by mouth 2 (two) times daily.       No current facility-administered medications for this visit.    Review of Systems Review of Systems  Constitutional:  Negative for fever, chills and unexpected weight change.  HENT: Negative for congestion, hearing loss, sore throat, trouble swallowing and voice change.   Eyes: Negative for visual disturbance.  Respiratory: Negative for cough and wheezing.   Cardiovascular: Negative for chest pain, palpitations and leg swelling.  Gastrointestinal: Negative for nausea, vomiting, abdominal pain, diarrhea, constipation, blood in stool, abdominal distention and anal bleeding.  Genitourinary: Negative for hematuria, vaginal bleeding and difficulty urinating.  Musculoskeletal: Negative for arthralgias.  Skin: Negative for rash and wound.  Neurological: Negative for seizures, syncope and headaches.  Hematological: Negative for adenopathy. Does not bruise/bleed easily.  Psychiatric/Behavioral: Negative for confusion.    Blood pressure 130/74, pulse 77, temperature 97.9 F (36.6 C), height 5\' 1"  (1.549 m), weight 283 lb (128.368 kg).  Physical Exam Physical Exam  Constitutional: She appears well-developed and well-nourished.  Musculoskeletal:       Legs:   Data Reviewed Notes from [facility]  Assessment    Chronic left inner thigh draining wound, ? abscess     Plan    I don't really identify an area excised or removed today. There is some drainage is coming out. There is some bruising associated with this also. I do think he would be the best thing to obtain an ultrasound of this  area to make sure there is not an underlying collection or another lesion prior to taking her to the operating room. She's going to continue her antibiotics will see me in the next couple of weeks again. She knows to call me sooner if this area gets worse.        Heavin Sebree 10/31/2013, 4:08 PM

## 2013-11-01 ENCOUNTER — Ambulatory Visit
Admission: RE | Admit: 2013-11-01 | Discharge: 2013-11-01 | Disposition: A | Discharge status: Home / Self Care | Payer: 59 | Source: Ambulatory Visit | Attending: General Surgery | Admitting: General Surgery

## 2013-11-01 DIAGNOSIS — L02419 Cutaneous abscess of limb, unspecified: Secondary | ICD-10-CM

## 2013-11-03 ENCOUNTER — Telehealth (INDEPENDENT_AMBULATORY_CARE_PROVIDER_SITE_OTHER): Payer: Self-pay

## 2013-11-03 DIAGNOSIS — R600 Localized edema: Secondary | ICD-10-CM

## 2013-11-03 DIAGNOSIS — T8189XS Other complications of procedures, not elsewhere classified, sequela: Secondary | ICD-10-CM

## 2013-11-03 NOTE — Telephone Encounter (Signed)
Pt returned call. I gave her the u/s results of the lower extremity showing a superficial fluid collection but radiologist recommending a mri of soft tissue structures. I wanted to explain that I placed the MRI order with [facility] and when trying to schedule the MRI [facility] advised me to check with the pt first since she was a [employer] employee she might get a better deal having the MRI done at [facility] w/the insurance. [facility] requires a copay of $500 up front with every MRI that comes thru the door and not sure if [facility] requires that much money up front. I explained to the pt that hospital's MRI machines are a little smaller so if she is claustrophobic at all the pt would really need to have done at [place] since their machines are more open. The pt would like for me to schedule at [facility]. I advised pt that I would turn the orders into scheduling for them to schedule and call her with the appt. I will call with results.

## 2013-11-03 NOTE — Telephone Encounter (Signed)
LMOM on cell and LM with someone at the home for pt to call me. I want to give her u/s results and talk to her about ordering another test.

## 2013-11-03 NOTE — Telephone Encounter (Signed)
Message copied by Illene Regulus on Thu Nov 03, 2013 10:54 AM ------      Message from: Donne Hazel, MATTHEW      Created: Wed Nov 02, 2013  3:23 PM       See below.  I think she should get mri            ----- Message -----         From: Rad Results In Interface         Sent: 11/01/2013   3:32 PM           To: Rolm Bookbinder, MD                   ------

## 2013-11-04 ENCOUNTER — Telehealth (INDEPENDENT_AMBULATORY_CARE_PROVIDER_SITE_OTHER): Payer: Self-pay | Admitting: *Deleted

## 2013-11-04 NOTE — Telephone Encounter (Signed)
LMOM for pt regarding her MRI of [facility].  Advise pt Elvina Sidle had the soonest appt and it is scheduled for 11-17-13 arriving at 8:00 a.m  Anderson Malta

## 2013-11-04 NOTE — Telephone Encounter (Signed)
Pt returned my call and she is aware of her appt for her MRI.  Anderson Malta

## 2013-11-17 ENCOUNTER — Ambulatory Visit (HOSPITAL_COMMUNITY): Payer: 59

## 2013-11-23 NOTE — Telephone Encounter (Signed)
LMOM for pt to call me back. Dr Donne Hazel is telling me now that the pt doesn't have to get the MRI that the pt can just be scheduled an appt to see him to discuss having this area excised. I see the pt is scheduled for the MRI on 8/11.

## 2013-11-24 NOTE — Telephone Encounter (Signed)
Pt returned call and spoke to [facility] in triage. Priscilla Webb gave the pt the message below that we could cx the MRI now per Dr Donne Hazel and just make an appt to come to the office to discuss having this area removed per Dr Donne Hazel. Appt made for 9/8. Pt cx'd the MRI.

## 2013-11-29 ENCOUNTER — Ambulatory Visit (HOSPITAL_COMMUNITY): Payer: 59

## 2013-12-27 ENCOUNTER — Encounter (INDEPENDENT_AMBULATORY_CARE_PROVIDER_SITE_OTHER): Payer: Commercial Managed Care - PPO | Admitting: General Surgery

## 2014-01-05 ENCOUNTER — Other Ambulatory Visit: Payer: Self-pay | Admitting: Family Medicine

## 2014-01-05 ENCOUNTER — Ambulatory Visit
Admission: RE | Admit: 2014-01-05 | Discharge: 2014-01-05 | Disposition: A | Discharge status: Home / Self Care | Payer: 59 | Source: Ambulatory Visit | Attending: Family Medicine | Admitting: Family Medicine

## 2014-01-05 DIAGNOSIS — R52 Pain, unspecified: Secondary | ICD-10-CM

## 2014-01-26 ENCOUNTER — Encounter (HOSPITAL_BASED_OUTPATIENT_CLINIC_OR_DEPARTMENT_OTHER): Payer: Self-pay | Admitting: *Deleted

## 2014-01-27 ENCOUNTER — Encounter (HOSPITAL_BASED_OUTPATIENT_CLINIC_OR_DEPARTMENT_OTHER): Payer: Self-pay | Admitting: *Deleted

## 2014-01-27 NOTE — Progress Notes (Signed)
To come in for labs

## 2014-01-30 ENCOUNTER — Encounter (HOSPITAL_BASED_OUTPATIENT_CLINIC_OR_DEPARTMENT_OTHER)
Admission: RE | Admit: 2014-01-30 | Discharge: 2014-01-30 | Disposition: A | Discharge status: Home / Self Care | Payer: 59 | Source: Ambulatory Visit | Attending: General Surgery | Admitting: General Surgery

## 2014-01-30 DIAGNOSIS — L720 Epidermal cyst: Secondary | ICD-10-CM | POA: Diagnosis not present

## 2014-01-30 DIAGNOSIS — Z91030 Bee allergy status: Secondary | ICD-10-CM | POA: Diagnosis not present

## 2014-01-30 DIAGNOSIS — Z880 Allergy status to penicillin: Secondary | ICD-10-CM | POA: Diagnosis not present

## 2014-01-30 DIAGNOSIS — L723 Sebaceous cyst: Secondary | ICD-10-CM | POA: Diagnosis present

## 2014-01-30 LAB — BASIC METABOLIC PANEL
Anion gap: 15 (ref 5–15)
BUN: 12 mg/dL (ref 6–23)
CALCIUM: 8.9 mg/dL (ref 8.4–10.5)
CO2: 26 mEq/L (ref 19–32)
CREATININE: 0.59 mg/dL (ref 0.50–1.10)
Chloride: 97 mEq/L (ref 96–112)
GFR calc Af Amer: >90 mL/min (ref >90)
GLUCOSE: 148 mg/dL — AB (ref 70–99)
Potassium: 4.2 mEq/L (ref 3.7–5.3)
SODIUM: 138 meq/L (ref 137–147)

## 2014-01-30 LAB — DIFFERENTIAL
Basophils Absolute: 0.1 10*3/uL (ref 0.0–0.1)
Basophils Relative: 1 % (ref 0–1)
EOS ABS: 0.3 10*3/uL (ref 0.0–0.7)
EOS PCT: 4 % (ref 0–5)
Lymphocytes Relative: 33 % (ref 12–46)
Lymphs Abs: 2.4 10*3/uL (ref 0.7–4.0)
Monocytes Absolute: 0.4 10*3/uL (ref 0.1–1.0)
Monocytes Relative: 6 % (ref 3–12)
Neutro Abs: 4.1 10*3/uL (ref 1.7–7.7)
Neutrophils Relative %: 56 % (ref 43–77)

## 2014-01-30 LAB — CBC
HCT: 42.3 % (ref 36.0–46.0)
Hemoglobin: 13.7 g/dL (ref 12.0–15.0)
MCH: 29.8 pg (ref 26.0–34.0)
MCHC: 32.4 g/dL (ref 30.0–36.0)
MCV: 92 fL (ref 78.0–100.0)
PLATELETS: 252 10*3/uL (ref 150–400)
RBC: 4.6 MIL/uL (ref 3.87–5.11)
RDW: 13.8 % (ref 11.5–15.5)
WBC: 7.3 10*3/uL (ref 4.0–10.5)

## 2014-01-30 NOTE — Progress Notes (Signed)
Pt seen and evaluated by Dr Al Corpus. Pt ok for surgery 02/02/14

## 2014-02-02 ENCOUNTER — Encounter (HOSPITAL_BASED_OUTPATIENT_CLINIC_OR_DEPARTMENT_OTHER): Payer: 59 | Admitting: Anesthesiology

## 2014-02-02 ENCOUNTER — Encounter (HOSPITAL_BASED_OUTPATIENT_CLINIC_OR_DEPARTMENT_OTHER): Payer: Self-pay | Admitting: *Deleted

## 2014-02-02 ENCOUNTER — Ambulatory Visit (HOSPITAL_BASED_OUTPATIENT_CLINIC_OR_DEPARTMENT_OTHER): Payer: 59 | Admitting: Anesthesiology

## 2014-02-02 ENCOUNTER — Ambulatory Visit (HOSPITAL_BASED_OUTPATIENT_CLINIC_OR_DEPARTMENT_OTHER)
Admission: RE | Admit: 2014-02-02 | Discharge: 2014-02-02 | Disposition: A | Discharge status: Home / Self Care | Payer: 59 | Source: Ambulatory Visit | Attending: General Surgery | Admitting: General Surgery

## 2014-02-02 ENCOUNTER — Encounter (HOSPITAL_BASED_OUTPATIENT_CLINIC_OR_DEPARTMENT_OTHER)
Admission: RE | Disposition: A | Discharge status: Home / Self Care | Payer: Self-pay | Source: Ambulatory Visit | Attending: General Surgery

## 2014-02-02 DIAGNOSIS — Z880 Allergy status to penicillin: Secondary | ICD-10-CM | POA: Insufficient documentation

## 2014-02-02 DIAGNOSIS — L720 Epidermal cyst: Secondary | ICD-10-CM | POA: Diagnosis not present

## 2014-02-02 DIAGNOSIS — Z91030 Bee allergy status: Secondary | ICD-10-CM | POA: Insufficient documentation

## 2014-02-02 HISTORY — PX: EAR CYST EXCISION: SHX22

## 2014-02-02 LAB — POCT HEMOGLOBIN-HEMACUE: HEMOGLOBIN: 15 g/dL (ref 12.0–15.0)

## 2014-02-02 SURGERY — CYST REMOVAL
Anesthesia: General | Site: Thigh | Laterality: Left

## 2014-02-02 MED ORDER — FENTANYL CITRATE 0.05 MG/ML IJ SOLN: 50.0000 ug | INTRAMUSCULAR | Status: DC | PRN

## 2014-02-02 MED ORDER — FENTANYL CITRATE 0.05 MG/ML IJ SOLN
INTRAMUSCULAR | Status: DC | PRN
  Administered 2014-02-02: 100 ug via INTRAVENOUS

## 2014-02-02 MED ORDER — MIDAZOLAM HCL 2 MG/2ML IJ SOLN: 1.0000 mg | INTRAMUSCULAR | Status: DC | PRN

## 2014-02-02 MED ORDER — DEXAMETHASONE SODIUM PHOSPHATE 4 MG/ML IJ SOLN
INTRAMUSCULAR | Status: DC | PRN
  Administered 2014-02-02: 10 mg via INTRAVENOUS

## 2014-02-02 MED ORDER — BUPIVACAINE HCL (PF) 0.25 % IJ SOLN
INTRAMUSCULAR | Status: AC
  Filled 2014-02-02: qty 90

## 2014-02-02 MED ORDER — OXYCODONE HCL 5 MG PO TABS
5.0000 mg | ORAL_TABLET | Freq: Once | ORAL | Status: AC | PRN
  Administered 2014-02-02: 5 mg via ORAL

## 2014-02-02 MED ORDER — CIPROFLOXACIN IN D5W 400 MG/200ML IV SOLN
400.0000 mg | INTRAVENOUS | Status: AC
  Administered 2014-02-02: 400 mg via INTRAVENOUS

## 2014-02-02 MED ORDER — LIDOCAINE HCL (CARDIAC) 20 MG/ML IV SOLN
INTRAVENOUS | Status: DC | PRN
  Administered 2014-02-02: 100 mg via INTRAVENOUS

## 2014-02-02 MED ORDER — CIPROFLOXACIN IN D5W 400 MG/200ML IV SOLN
INTRAVENOUS | Status: AC
  Filled 2014-02-02: qty 200

## 2014-02-02 MED ORDER — HYDROCODONE-ACETAMINOPHEN 5-325 MG PO TABS: 1.0000 | ORAL_TABLET | ORAL | Status: DC | PRN

## 2014-02-02 MED ORDER — OXYCODONE HCL 5 MG PO TABS
ORAL_TABLET | ORAL | Status: AC
  Filled 2014-02-02: qty 1

## 2014-02-02 MED ORDER — LACTATED RINGERS IV SOLN
INTRAVENOUS | Status: DC
  Administered 2014-02-02 (×2): via INTRAVENOUS

## 2014-02-02 MED ORDER — FENTANYL CITRATE 0.05 MG/ML IJ SOLN
25.0000 ug | INTRAMUSCULAR | Status: DC | PRN
  Administered 2014-02-02 (×2): 25 ug via INTRAVENOUS

## 2014-02-02 MED ORDER — OXYCODONE HCL 5 MG/5ML PO SOLN: 5.0000 mg | Freq: Once | ORAL | Status: AC | PRN

## 2014-02-02 MED ORDER — PROPOFOL 10 MG/ML IV BOLUS
INTRAVENOUS | Status: DC | PRN
  Administered 2014-02-02: 180 mg via INTRAVENOUS

## 2014-02-02 MED ORDER — PROMETHAZINE HCL 25 MG/ML IJ SOLN: 6.2500 mg | INTRAMUSCULAR | Status: DC | PRN

## 2014-02-02 MED ORDER — ONDANSETRON HCL 4 MG/2ML IJ SOLN
INTRAMUSCULAR | Status: DC | PRN
Start: 2014-02-02 — End: 2014-02-02
  Administered 2014-02-02: 4 mg via INTRAVENOUS

## 2014-02-02 MED ORDER — FENTANYL CITRATE 0.05 MG/ML IJ SOLN
INTRAMUSCULAR | Status: AC
  Filled 2014-02-02: qty 6

## 2014-02-02 MED ORDER — BUPIVACAINE HCL (PF) 0.25 % IJ SOLN
INTRAMUSCULAR | Status: DC | PRN
  Administered 2014-02-02: 10 mL

## 2014-02-02 MED ORDER — FENTANYL CITRATE 0.05 MG/ML IJ SOLN
INTRAMUSCULAR | Status: AC
  Filled 2014-02-02: qty 2

## 2014-02-02 MED ORDER — MIDAZOLAM HCL 5 MG/5ML IJ SOLN
INTRAMUSCULAR | Status: DC | PRN
  Administered 2014-02-02: 2 mg via INTRAVENOUS

## 2014-02-02 MED ORDER — MIDAZOLAM HCL 2 MG/2ML IJ SOLN
INTRAMUSCULAR | Status: AC
  Filled 2014-02-02: qty 2

## 2014-02-02 SURGICAL SUPPLY — 48 items
ADH SKN CLS APL DERMABOND .7 (GAUZE/BANDAGES/DRESSINGS) ×1
BLADE CLIPPER SURG (BLADE) IMPLANT
BLADE SURG 15 STRL LF DISP TIS (BLADE) ×1 IMPLANT
BLADE SURG 15 STRL SS (BLADE) ×2
CANISTER SUCT 1200ML W/VALVE (MISCELLANEOUS) IMPLANT
CHLORAPREP W/TINT 26ML (MISCELLANEOUS) ×2 IMPLANT
COVER BACK TABLE 60X90IN (DRAPES) ×2 IMPLANT
COVER MAYO STAND STRL (DRAPES) ×2 IMPLANT
DECANTER SPIKE VIAL GLASS SM (MISCELLANEOUS) IMPLANT
DERMABOND ADVANCED (GAUZE/BANDAGES/DRESSINGS) ×1
DERMABOND ADVANCED .7 DNX12 (GAUZE/BANDAGES/DRESSINGS) ×1 IMPLANT
DRAPE PED LAPAROTOMY (DRAPES) ×2 IMPLANT
DRSG TEGADERM 4X4.75 (GAUZE/BANDAGES/DRESSINGS) ×1 IMPLANT
ELECT COATED BLADE 2.86 ST (ELECTRODE) ×2 IMPLANT
ELECT REM PT RETURN 9FT ADLT (ELECTROSURGICAL) ×2
ELECTRODE REM PT RTRN 9FT ADLT (ELECTROSURGICAL) ×1 IMPLANT
GAUZE PACKING IODOFORM 1/4X15 (GAUZE/BANDAGES/DRESSINGS) IMPLANT
GLOVE BIO SURGEON STRL SZ7 (GLOVE) ×4 IMPLANT
GLOVE BIOGEL PI IND STRL 7.0 (GLOVE) IMPLANT
GLOVE BIOGEL PI IND STRL 7.5 (GLOVE) ×1 IMPLANT
GLOVE BIOGEL PI INDICATOR 7.0 (GLOVE) ×1
GLOVE BIOGEL PI INDICATOR 7.5 (GLOVE) ×1
GOWN STRL REUS W/ TWL LRG LVL3 (GOWN DISPOSABLE) ×3 IMPLANT
GOWN STRL REUS W/TWL LRG LVL3 (GOWN DISPOSABLE) ×4
NDL HYPO 25X1 1.5 SAFETY (NEEDLE) ×1 IMPLANT
NEEDLE HYPO 25X1 1.5 SAFETY (NEEDLE) ×2 IMPLANT
NS IRRIG 1000ML POUR BTL (IV SOLUTION) IMPLANT
PACK BASIN DAY SURGERY FS (CUSTOM PROCEDURE TRAY) ×2 IMPLANT
PENCIL BUTTON HOLSTER BLD 10FT (ELECTRODE) ×2 IMPLANT
SPONGE GAUZE 4X4 12PLY STER LF (GAUZE/BANDAGES/DRESSINGS) ×1 IMPLANT
STRIP CLOSURE SKIN 1/2X4 (GAUZE/BANDAGES/DRESSINGS) ×2 IMPLANT
SUT ETHILON 2 0 FS 18 (SUTURE) IMPLANT
SUT MNCRL AB 4-0 PS2 18 (SUTURE) ×2 IMPLANT
SUT SILK 2 0 SH (SUTURE) IMPLANT
SUT VIC AB 2-0 SH 27 (SUTURE)
SUT VIC AB 2-0 SH 27XBRD (SUTURE) IMPLANT
SUT VIC AB 3-0 SH 27 (SUTURE) ×4
SUT VIC AB 3-0 SH 27X BRD (SUTURE) ×2 IMPLANT
SUT VICRYL 3-0 CR8 SH (SUTURE) IMPLANT
SUT VICRYL 4-0 PS2 18IN ABS (SUTURE) IMPLANT
SWAB COLLECTION DEVICE MRSA (MISCELLANEOUS) IMPLANT
SYR CONTROL 10ML LL (SYRINGE) ×2 IMPLANT
TOWEL OR 17X24 6PK STRL BLUE (TOWEL DISPOSABLE) ×2 IMPLANT
TOWEL OR NON WOVEN STRL DISP B (DISPOSABLE) ×2 IMPLANT
TUBE ANAEROBIC SPECIMEN COL (MISCELLANEOUS) IMPLANT
TUBE CONNECTING 20X1/4 (TUBING) IMPLANT
UNDERPAD 30X30 INCONTINENT (UNDERPADS AND DIAPERS) ×2 IMPLANT
YANKAUER SUCT BULB TIP NO VENT (SUCTIONS) IMPLANT

## 2014-02-02 NOTE — Op Note (Signed)
Preoperative diagnosis: left inner thigh sebaceous cyst Postoperative diagnosis: same as above Procedure: excision of 2x2 cm subcutaneous left inner thigh sebaceous cyst Surgeon: Dr Serita Grammes EBL: minimal Comps: none Drains: none Pathology: specimen to pathology Anesthesia: Gen. Sponge and needle count correct to completion Disposition to recovery in stable condition  Indications: This is a 48 year old female who presents with a history of a draining left inner thigh lesion. We did an ultrasound that does show a mass associated with this as it was difficult to identify on the exam. We discussed excising this area due to continued drainage and episodes of infection.  Procedure: Both the patient and I identified the area prior to beginning. After informed consent was obtained she was then taken to the operating room. She was placed under anesthesia. She had sequential compression devices on her legs. She was given ciprofloxacin due to her allergies. She was then prepped and draped in the standard sterile surgical fashion. A surgical timeout was then performed.  I made an elliptical incision surrounding the area that went to the skin. I then used cautery to excise this area completely. There was no evidence of any further residual cyst and there was a mass present in my specimen. I then obtained hemostasis. I closed this with 3-0 Vicryl and 4-0 Monocryl. Dermabond and Steri-Strips are placed over this. She tolerated this well was extubated and transferred to the recovery room in stable condition.

## 2014-02-02 NOTE — Anesthesia Procedure Notes (Signed)
Procedure Name: LMA Insertion Date/Time: 02/02/2014 7:40 AM Performed by: Tawni Millers Pre-anesthesia Checklist: Patient identified, Emergency Drugs available, Suction available and Patient being monitored Patient Re-evaluated:Patient Re-evaluated prior to inductionOxygen Delivery Method: Circle System Utilized Preoxygenation: Pre-oxygenation with 100% oxygen Intubation Type: IV induction Ventilation: Mask ventilation without difficulty LMA: LMA inserted LMA Size: 4.0 Number of attempts: 1 Airway Equipment and Method: bite block Placement Confirmation: positive ETCO2 Tube secured with: Tape Dental Injury: Teeth and Oropharynx as per pre-operative assessment

## 2014-02-02 NOTE — Transfer of Care (Signed)
Immediate Anesthesia Transfer of Care Note  Patient: Priscilla Webb  Procedure(s) Performed: Procedure(s): LEFT THIGH SEBACEOUS CYST EXCISION 2.8 X 2 CM (Left)  Patient Location: PACU  Anesthesia Type:General  Level of Consciousness: awake and patient cooperative  Airway & Oxygen Therapy: Patient Spontanous Breathing and Patient connected to face mask oxygen  Post-op Assessment: Report given to PACU RN and Post -op Vital signs reviewed and stable  Post vital signs: Reviewed and stable  Complications: No apparent anesthesia complications

## 2014-02-02 NOTE — H&P (Signed)
The patient is a 48 year old female who presents with a complaint of Mass. 88 yof who I saw previously with cyst upper inner thigh that was vague. repeated infections of which she has had another in last month treated with abx. She is better now. Underwent US that shows a 2.8x1.1x2 cm complex superficial fluid collection in the left inner thigh.   Other Problems Alexander Bergeron [place], [place]; 12/27/2013 4:39 PM) Heart murmur  Past Surgical History ([place], [place]; 12/27/2013 4:39 PM) Colon Polyp Removal - Colonoscopy Hysterectomy (not due to cancer) - Complete Tonsillectomy  Diagnostic Studies History ([place], [place]; 12/27/2013 4:39 PM) Colonoscopy within last year Mammogram >3 years ago Pap Smear 1-5 years ago  Allergies ([place], [place]; 12/27/2013 4:22 PM) Wasp Stings Yellow Jacket Stings Penicillins Doxycycline *DERMATOLOGICALS*  Medication History ([place], RMA; 12/27/2013 4:39 PM) Ibuprofen (200MG  Capsule, Oral) Active. Multivitamin (Oral) Active. Medications Reconciled  Social History Alexander Bergeron [place], [place]; 12/27/2013 4:39 PM) Alcohol use Occasional alcohol use. Caffeine use Carbonated beverages, Coffee, Tea. No drug use Tobacco use Never smoker.  Family History Alexander Bergeron [place], [place]; 12/27/2013 4:39 PM) Alcohol Abuse Father, Mother. Arthritis Father, Mother. Breast Cancer Family Members In General. Cerebrovascular Accident Brother. Colon Cancer Father. Depression Father. Diabetes Mellitus Family Members In General. Heart Disease Father. Heart disease in female family member before age 43 Migraine Headache Brother. Rectal Cancer Father. Respiratory Condition Father. Seizure disorder Brother.  Pregnancy / Birth History Alexander Bergeron [place], [place]; 12/27/2013 4:39 PM) Age at menarche 78 years. Gravida 0 Irregular periods  Review of Systems ([place] RMA; 12/27/2013 4:39  PM) General Present- Fatigue. Not Present- Appetite Loss, Chills, Fever, Night Sweats, Weight Gain and Weight Loss. Skin Present- Non-Healing Wounds. Not Present- Change in Wart/Mole, Dryness, Hives, Jaundice, New Lesions, Rash and Ulcer. HEENT Not Present- Earache, Hearing Loss, Hoarseness, Nose Bleed, Oral Ulcers, Ringing in the Ears, Seasonal Allergies, Sinus Pain, Sore Throat, Visual Disturbances, Wears glasses/contact lenses and Yellow Eyes. Respiratory Not Present- Bloody sputum, Chronic Cough, Difficulty Breathing, Snoring and Wheezing. Breast Not Present- Breast Mass, Breast Pain, Nipple Discharge and Skin Changes. Cardiovascular Not Present- Chest Pain, Difficulty Breathing Lying Down, Leg Cramps, Palpitations, Rapid Heart Rate, Shortness of Breath and Swelling of Extremities. Gastrointestinal Not Present- Abdominal Pain, Bloating, Bloody Stool, Change in Bowel Habits, Chronic diarrhea, Constipation, Difficulty Swallowing, Excessive gas, Gets full quickly at meals, Hemorrhoids, Indigestion, Nausea, Rectal Pain and Vomiting. Female Genitourinary Not Present- Frequency, Nocturia, Painful Urination, Pelvic Pain and Urgency. Musculoskeletal Not Present- Back Pain, Joint Pain, Joint Stiffness, Muscle Pain, Muscle Weakness and Swelling of Extremities. Neurological Not Present- Decreased Memory, Fainting, Headaches, Numbness, Seizures, Tingling, Tremor, Trouble walking and Weakness. Psychiatric Not Present- Anxiety, Bipolar, Change in Sleep Pattern, Depression, Fearful and Frequent crying. Endocrine Not Present- Cold Intolerance, Excessive Hunger, Hair Changes, Heat Intolerance, Hot flashes and New Diabetes. Hematology Not Present- Easy Bruising, Excessive bleeding, Gland problems, HIV and Persistent Infections.   Vitals ([place]don[place] RMA; 12/27/2013 4:41 PM) 12/27/2013 4:39 PM Weight: 289 lb Height: 60in Body Surface Area: 2.36 m Body Mass Index: 56.44 kg/m Temp.: 98.8F(Oral)   Pulse: 101 (Regular)  BP: 134/80 (Sitting, Left Arm, Standard)    Physical Exam Rolm Bookbinder MD; 12/27/2013 4:52 PM) Musculoskeletal Note: left inner thigh with two holes overlying 2 cm area of thickened tissue, no real discrete mass CV: rrr Pulm clear bilaterally    Assessment & Plan Rolm Bookbinder MD; 12/27/2013 4:53 PM) SEBACEOUS CYST (706.2  L72.3) Impression: discussed ultrasound findings. will plan for  excision. risks include but not limited to bleeding, infection and recurrence

## 2014-02-02 NOTE — Anesthesia Preprocedure Evaluation (Addendum)
Anesthesia Evaluation  Patient identified by MRN, date of birth, ID band Patient awake    Reviewed: Allergy & Precautions, H&P , NPO status , Patient's Chart, lab work & pertinent test results  Airway Mallampati: III TM Distance: >3 FB Neck ROM: Full    Dental  (+) Teeth Intact, Dental Advisory Given   Pulmonary  breath sounds clear to auscultation        Cardiovascular negative cardio ROS  Rhythm:Regular Rate:Normal     Neuro/Psych negative neurological ROS  negative psych ROS   GI/Hepatic negative GI ROS, Neg liver ROS,   Endo/Other  Morbid obesity  Renal/GU negative Renal ROS     Musculoskeletal   Abdominal   Peds  Hematology negative hematology ROS (+)   Anesthesia Other Findings   Reproductive/Obstetrics                          Anesthesia Physical Anesthesia Plan  ASA: III  Anesthesia Plan: General   Post-op Pain Management:    Induction: Intravenous  Airway Management Planned: Oral ETT and LMA  Additional Equipment:   Intra-op Plan:   Post-operative Plan: Extubation in OR  Informed Consent: I have reviewed the patients History and Physical, chart, labs and discussed the procedure including the risks, benefits and alternatives for the proposed anesthesia with the patient or authorized representative who has indicated his/her understanding and acceptance.   Dental advisory given  Plan Discussed with: CRNA and Surgeon  Anesthesia Plan Comments:         Anesthesia Quick Evaluation

## 2014-02-02 NOTE — Discharge Instructions (Signed)
[facility]  POST OP INSTRUCTIONS  Always review your discharge instruction sheet given to you by the facility where your surgery was performed. IF YOU HAVE DISABILITY OR FAMILY LEAVE FORMS, YOU MUST BRING THEM TO THE OFFICE FOR PROCESSING.   DO NOT GIVE THEM TO YOUR DOCTOR.  1. A prescription for pain medication may be given to you upon discharge.  Take your pain medication as prescribed, if needed.  If narcotic pain medicine is not needed, then you may take acetaminophen (Tylenol), naprosyn (Alleve), or ibuprofen (Advil) as needed. 2. Take your usually prescribed medications unless otherwise directed. 3. If you need a refill on your pain medication, please contact your pharmacy.  They will contact our office to request authorization. Prescriptions will not be filled after 5pm or on week-ends. 4. You should follow a light diet the first few days after arrival home, such as soup and crackers, etc.  Be sure to include lots of fluids daily. 5. Most patients will experience some swelling and bruising in the area of the incision.  Ice packs will help.  Swelling and bruising can take several days to resolve.  6. It is common to experience some constipation if taking pain medication after surgery.  Increasing fluid intake and taking a stool softener (such as Colace) will usually help or prevent this problem from occurring.  A mild laxative (Milk of Magnesia or Miralax) should be taken according to package instructions if there are no bowel movements after 48 hours. 7. Unless discharge instructions indicate otherwise, you may remove your bandages 48 hours after surgery, and you may shower at that time.  You may have steri-strips (small skin tapes) in place directly over the incision.  These strips should be left on the skin for about 14 days.  If your surgeon used skin glue on the incision, you may shower in 24 hours.  The glue will flake off over the next  2-3 weeks.  Any sutures or staples will be removed at the office during your follow-up visit. 8. ACTIVITIES:  You may resume regular (light) daily activities beginning the next day--such as daily self-care, walking, climbing stairs--gradually increasing activities as tolerated.  You may have sexual intercourse when it is comfortable.  Refrain from any heavy lifting or straining until approved by your doctor. a. You may drive when you are no longer taking prescription pain medication, you can comfortably wear a seatbelt, and you can safely maneuver your car and apply brakes. b. RETURN TO WORK:  __________________________________________________________ 9. You should see your doctor in the office for a follow-up appointment approximately 2-3 weeks after your surgery.  Make sure that you call for this appointment within a day or two after you arrive home to insure a convenient appointment time. 10. OTHER INSTRUCTIONS: __________________________________________________________________________________________________________________________ __________________________________________________________________________________________________________________________ WHEN TO CALL YOUR DOCTOR: 1. Fever over 101.0 2. Inability to urinate 3. Continued bleeding from incision. 4. Increased pain, redness, or drainage from the incision. 5. Increasing abdominal pain  The clinic staff is available to answer your questions during regular business hours.  Please dont hesitate to call and ask to speak to one of the nurses for clinical concerns.  If you have a medical emergency, go to the nearest emergency room or call 911.  A surgeon from [facility] is always on call at the hospital. [address], [place], [place], [place]  [address] ? [address], [place], [place]   [address] [phone] ? [phone] ? FAX [phone] Web site: [email or web address]    Post Anesthesia Home Care  Instructions  Activity: Get plenty of rest for the remainder of the day. A responsible adult should stay with you for 24 hours following the procedure.  For the next 24 hours, DO NOT: -Drive a car -Paediatric nurse -Drink alcoholic beverages -Take any medication unless instructed by your physician -Make any legal decisions or sign important papers.  Meals: Start with liquid foods such as gelatin or soup. Progress to regular foods as tolerated. Avoid greasy, spicy, heavy foods. If nausea and/or vomiting occur, drink only clear liquids until the nausea and/or vomiting subsides. Call your physician if vomiting continues.  Special Instructions/Symptoms: Your throat may feel dry or sore from the anesthesia or the breathing tube placed in your throat during surgery. If this causes discomfort, gargle with warm salt water. The discomfort should disappear within 24 hours.

## 2014-02-02 NOTE — Anesthesia Postprocedure Evaluation (Signed)
  Anesthesia Post-op Note  Patient: Priscilla Webb  Procedure(s) Performed: Procedure(s): LEFT THIGH SEBACEOUS CYST EXCISION 2.8 X 2 CM (Left)  Patient Location: PACU  Anesthesia Type:General  Level of Consciousness: awake, alert  and oriented  Airway and Oxygen Therapy: Patient Spontanous Breathing  Post-op Pain: none  Post-op Assessment: Post-op Vital signs reviewed  Post-op Vital Signs: Reviewed  Last Vitals:  Filed Vitals:   02/02/14 0900  BP: 146/92  Pulse: 96  Temp:   Resp: 15    Complications: No apparent anesthesia complications

## 2014-02-06 ENCOUNTER — Encounter (HOSPITAL_BASED_OUTPATIENT_CLINIC_OR_DEPARTMENT_OTHER): Payer: Self-pay | Admitting: General Surgery

## 2014-02-17 ENCOUNTER — Other Ambulatory Visit: Payer: Self-pay | Admitting: Family Medicine

## 2014-02-17 DIAGNOSIS — E041 Nontoxic single thyroid nodule: Secondary | ICD-10-CM

## 2014-03-01 ENCOUNTER — Ambulatory Visit
Admission: RE | Admit: 2014-03-01 | Discharge: 2014-03-01 | Disposition: A | Discharge status: Home / Self Care | Payer: 59 | Source: Ambulatory Visit | Attending: Family Medicine | Admitting: Family Medicine

## 2014-03-01 DIAGNOSIS — E041 Nontoxic single thyroid nodule: Secondary | ICD-10-CM

## 2014-12-11 ENCOUNTER — Ambulatory Visit: Payer: 59 | Admitting: Dietician

## 2015-01-17 ENCOUNTER — Encounter: Payer: Self-pay | Admitting: Dietician

## 2015-01-17 ENCOUNTER — Encounter: Payer: 59 | Attending: Family Medicine | Admitting: Dietician

## 2015-01-17 DIAGNOSIS — Z713 Dietary counseling and surveillance: Secondary | ICD-10-CM | POA: Diagnosis not present

## 2015-01-17 DIAGNOSIS — R7309 Other abnormal glucose: Secondary | ICD-10-CM | POA: Insufficient documentation

## 2015-01-17 DIAGNOSIS — R7303 Prediabetes: Secondary | ICD-10-CM

## 2015-01-17 NOTE — Progress Notes (Signed)
  Medical Nutrition Therapy:  Appt start time: 1031 end time:  0940.   Assessment:  Primary concerns today: Athalie states that she is here because she is in the prediabetes range, her HgbA1c was in the 6's per patient. She also reports that she has been diagnosed with PCOS and this makes weight loss more difficult. She reports she has been trying to lose weight on her own for a while. She declined an updated weight today but reports she was 295 lbs at home this morning. Destony reports that she has considered the gastric bypass but considers this a "last resort". 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 However, she is able to move around some and has a stand-up desk. Works three 12-hour shifts per week (6am-6pm). She lives with her husband and her mom. They all share the grocery shopping responsibilities. Her husband was recently diagnosed with type 2 diabetes and they have been working as a family to eat healthy foods. Annett has tried many diets in the past including Sugar Busters, Weight Watchers, Atkins, and others without longterm success.   Preferred Learning Style:  No preference indicated   Learning Readiness:   Ready   MEDICATIONS: see list   DIETARY INTAKE:  Usual eating pattern includes 2-3 meals and 1-2 snacks per day. Avoided foods include mushrooms, beets.    24-hr recall:  B ( AM): black coffee and peanut butter and jelly sandwich from [employer] OR skips if she is not working   Snk ( AM):   L ( PM): leftovers Snk (3 PM): granola bar or trail mix D (6:30 PM): pizza OR baked BBQ chicken with green beans and potatoes Snk ( PM): not usually, sometimes potato chips  Beverages: coffee, water, rarely soda or juice  Usual physical activity: has a gym membership; exercises 1-2x a month  Estimated energy needs: 1600-1800 calories 180-200 g carbohydrates 120-135 g protein 44-50 g fat  Progress Towards Goal(s):  In progress.   Nutritional Diagnosis:   [place]-2.2 Altered nutrition-related laboratory As related to obesity and physical inactivity.  As evidenced by HgbA1c in prediabetes range.    Intervention:  Nutrition counseling provided. Explained glucose metabolism and insulin action. Identified carbohydrate foods and recommended carbs high in fiber and low in sugar. Recommended a protein food with most meals and snacks. Emphasized the importance of consistent exercise for weight loss and glycemic control.  Goals: -Increase physical activity  -Start walking regularly (outside or at the gym)  -Days that you don't work: 2 short dog walks per day  -1 mile walk video on days that you work   -Set a realistic step goal -Continue to choose carbohydrates higher in fiber and lower in sugar -Consider using My Fitness Pal again to help hold you accountable with portion sizes -Avoid skipping breakfast   -Try a Premier protein shake for breakfast if you aren't hungry  Teaching Method Utilized:  Visual Auditory  Handouts given during visit include:  Meal planning card  Myplate  Barriers to learning/adherence to lifestyle change: none  Demonstrated degree of understanding via:  Teach Back   Monitoring/Evaluation:  Dietary intake, exercise, labs, and body weight in 3 month(s) with Lindajo Royal, RD, CDE.

## 2015-01-17 NOTE — Patient Instructions (Addendum)
-  Increase physical activity  -Start walking regularly (outside or at the gym)  -Days that you don't work: 2 short dog walks per day  -1 mile walk video on days that you work   -Set a realistic step goal  -Continue to choose carbohydrates higher in fiber and lower in sugar  -Consider using My Fitness Pal again to help hold you accountable with portion sizes  -Avoid skipping breakfast   -Try a Premier protein shake for breakfast if you aren't hungry

## 2015-04-10 ENCOUNTER — Ambulatory Visit: Payer: 59 | Admitting: *Deleted

## 2015-05-03 DIAGNOSIS — E669 Obesity, unspecified: Secondary | ICD-10-CM | POA: Diagnosis not present

## 2015-05-03 DIAGNOSIS — E559 Vitamin D deficiency, unspecified: Secondary | ICD-10-CM | POA: Diagnosis not present

## 2015-05-03 DIAGNOSIS — Z86010 Personal history of colonic polyps: Secondary | ICD-10-CM | POA: Diagnosis not present

## 2015-05-03 DIAGNOSIS — Z800 Family history of malignant neoplasm of digestive organs: Secondary | ICD-10-CM | POA: Diagnosis not present

## 2015-05-03 DIAGNOSIS — R7309 Other abnormal glucose: Secondary | ICD-10-CM | POA: Diagnosis not present

## 2015-05-03 DIAGNOSIS — L02439 Carbuncle of limb, unspecified: Secondary | ICD-10-CM | POA: Diagnosis not present

## 2015-10-30 DIAGNOSIS — L02412 Cutaneous abscess of left axilla: Secondary | ICD-10-CM | POA: Diagnosis not present

## 2015-10-30 MED FILL — SULFAMETHOXAZOLE/TMP DS TAB: 800-160 | 10 days supply | Qty: 20 | Fill #0

## 2015-11-08 MED FILL — SULFAMETHOXAZOLE/TMP DS TAB: 800-160 | 7 days supply | Qty: 14 | Fill #0

## 2016-01-06 IMAGING — US US SOFT TISSUE HEAD/NECK
1 series · 14 of 25 positions shown · non-contrast
Comparison: 03/24/2011

CLINICAL DATA: Right thyroid nodule, subsequent counter

EXAM:
THYROID ULTRASOUND
TECHNIQUE: Ultrasound examination of the thyroid gland and adjacent soft
tissues was performed.

[Series 1: us soft tissue head/neck · 0.09mm/px · 14 of 35 slices shown]
[im 1/35]
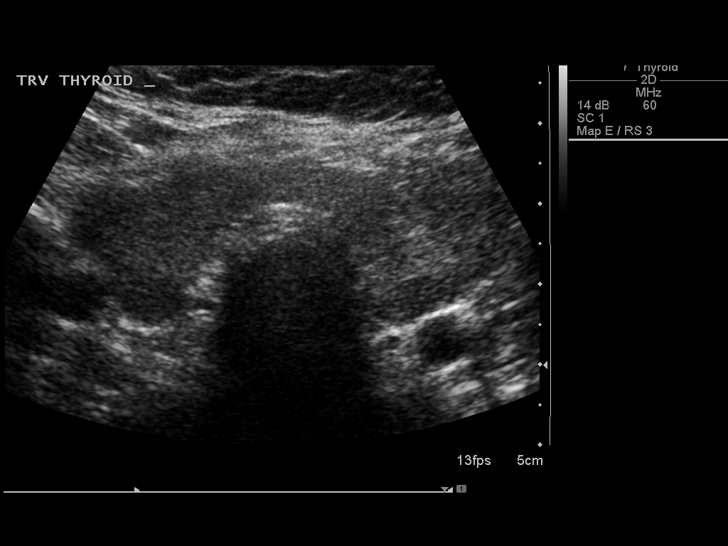
[im 3/35]
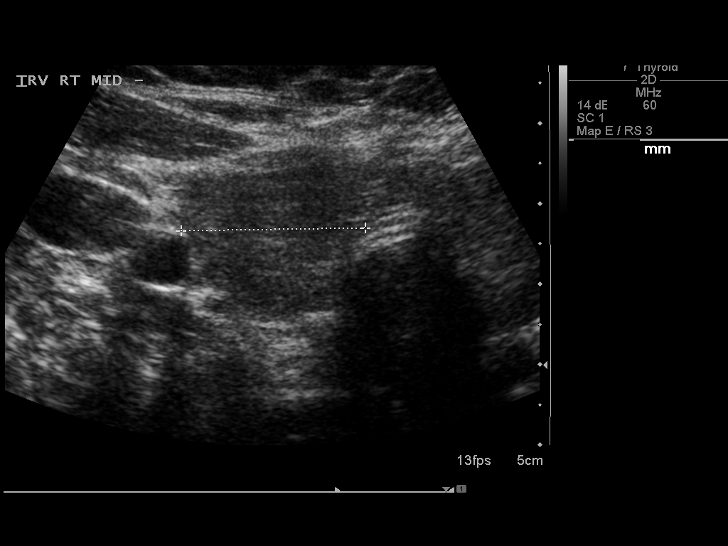
[im 6/35]
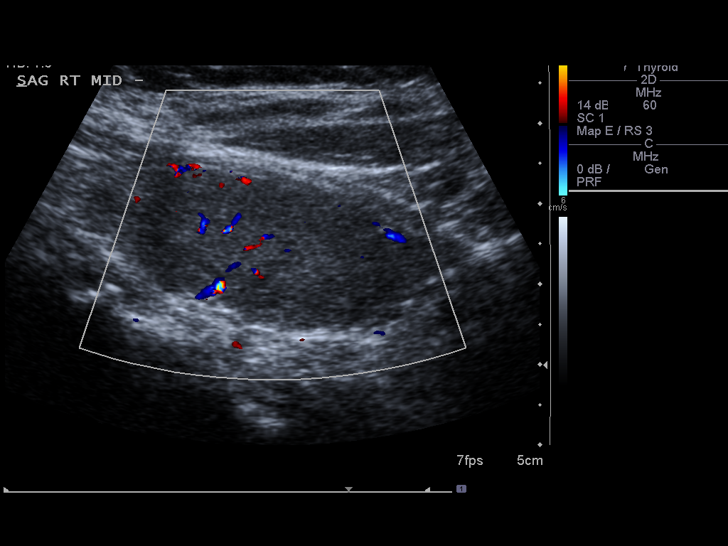
[im 9/35]
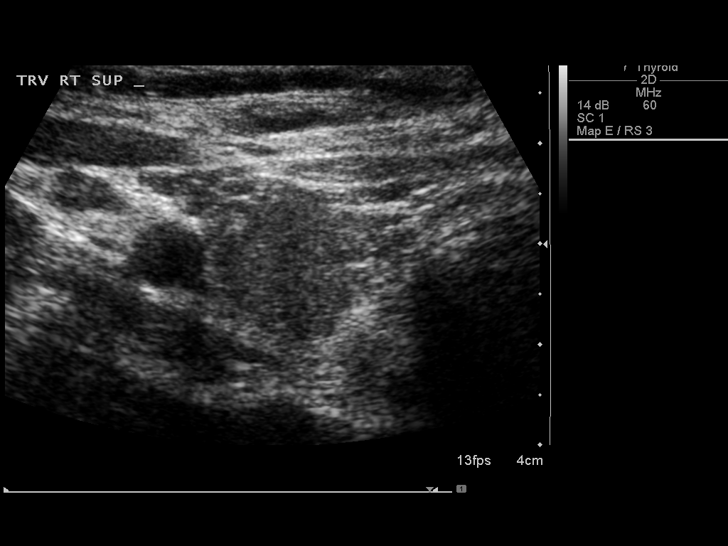
[im 12/35]
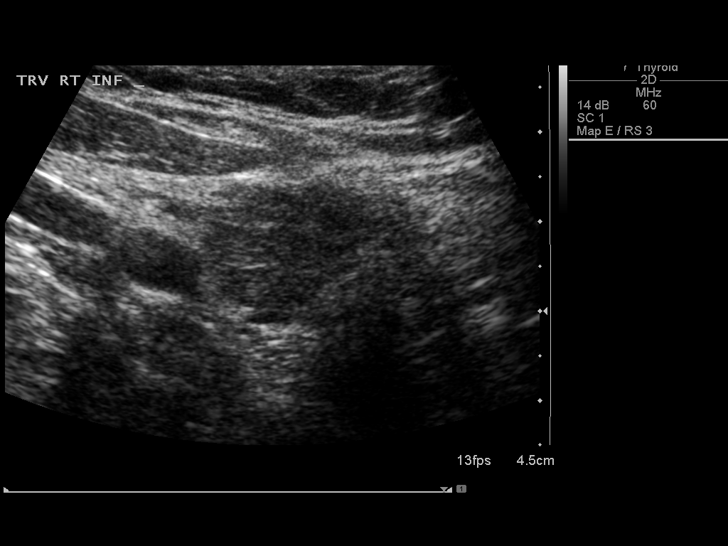
[im 13/35]
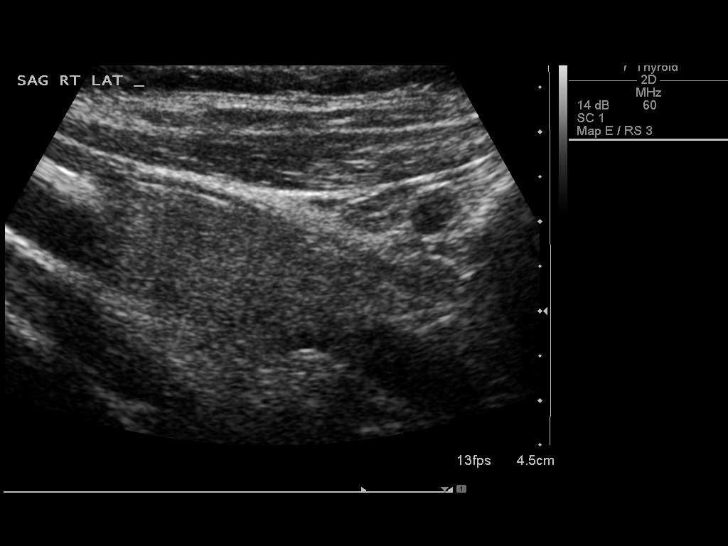
[im 16/35]
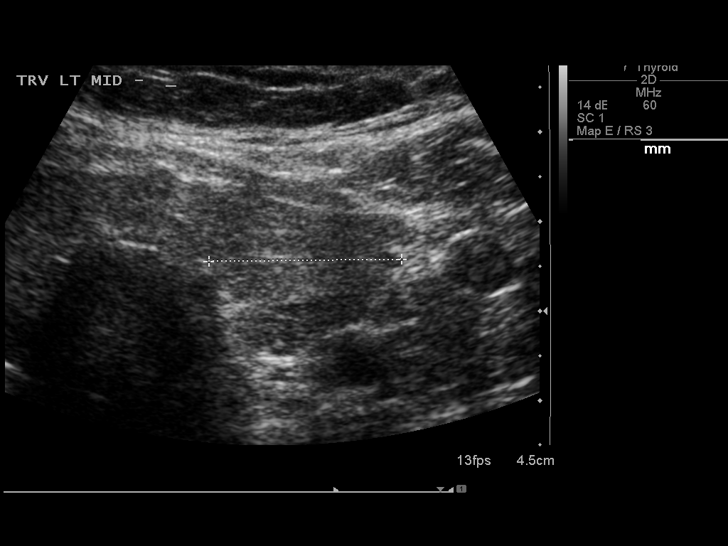
[im 19/35]
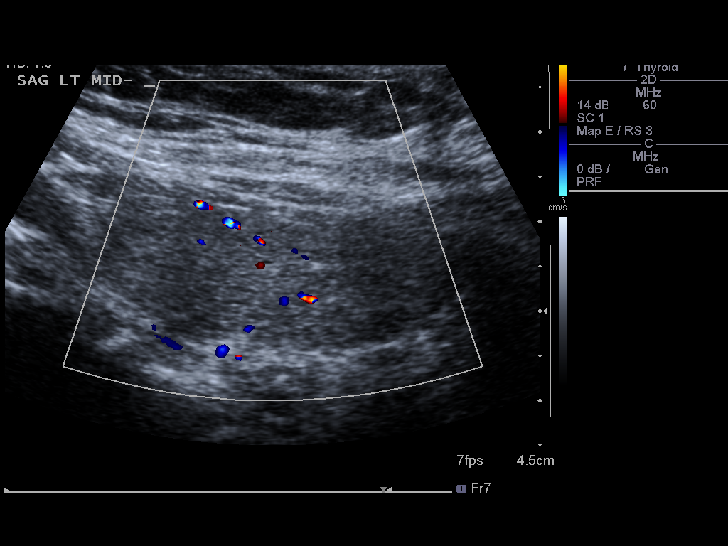
[im 22/35]
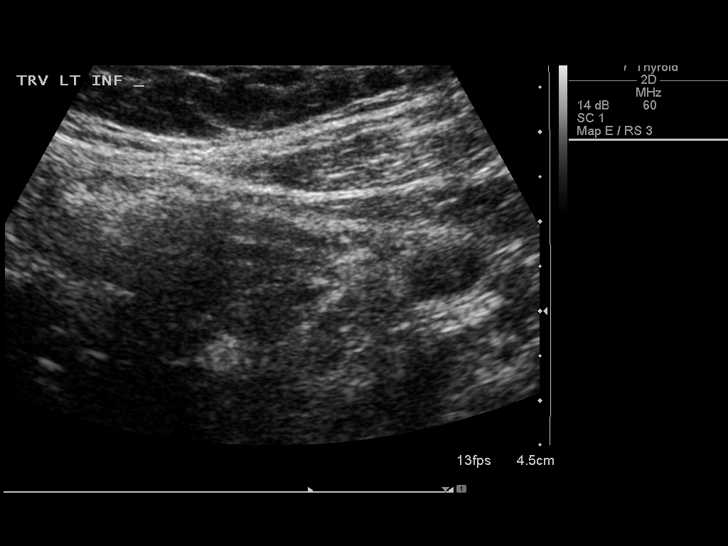
[im 23/35]
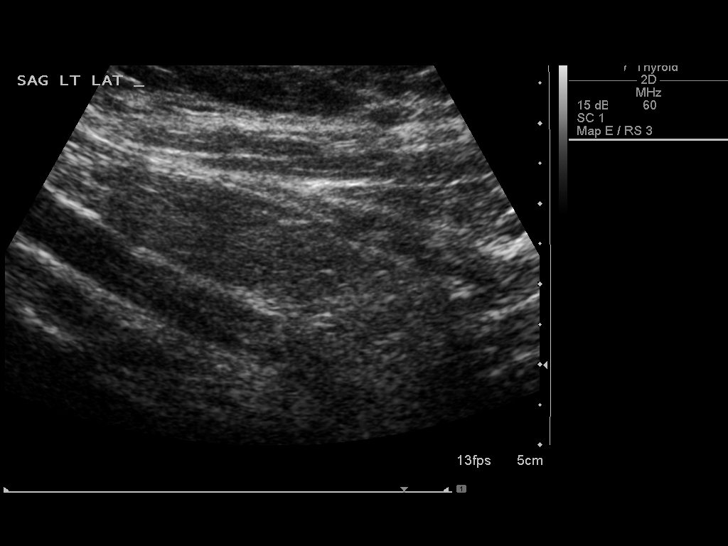
[im 26/35]
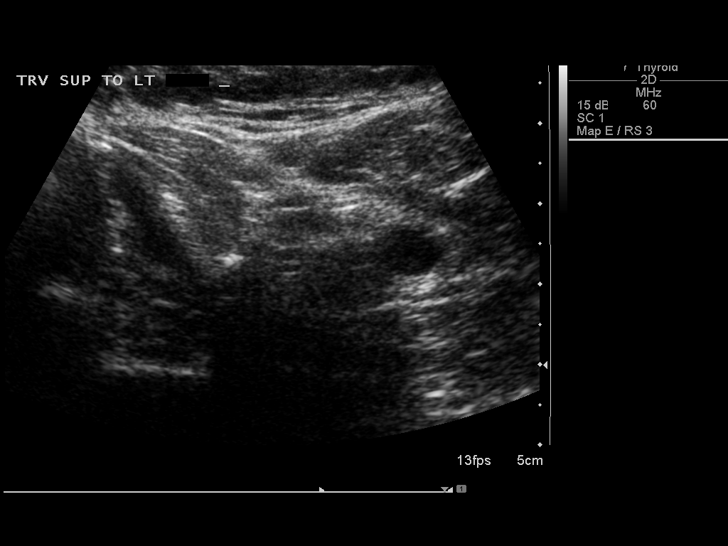
[im 29/35]
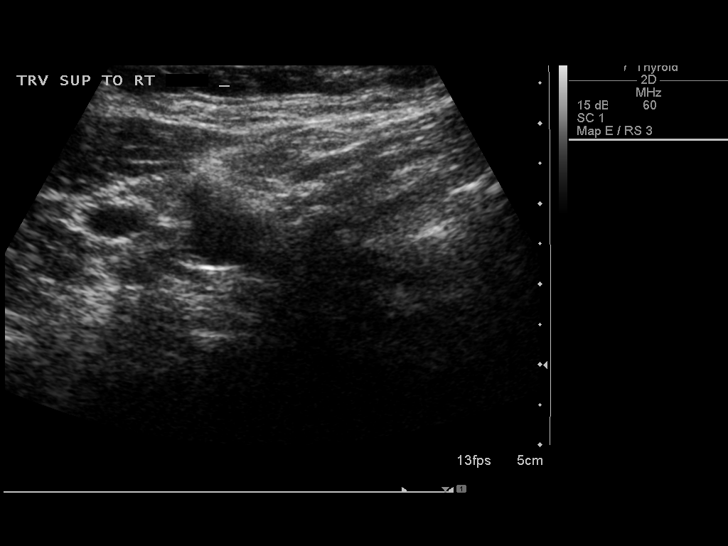
[im 32/35]
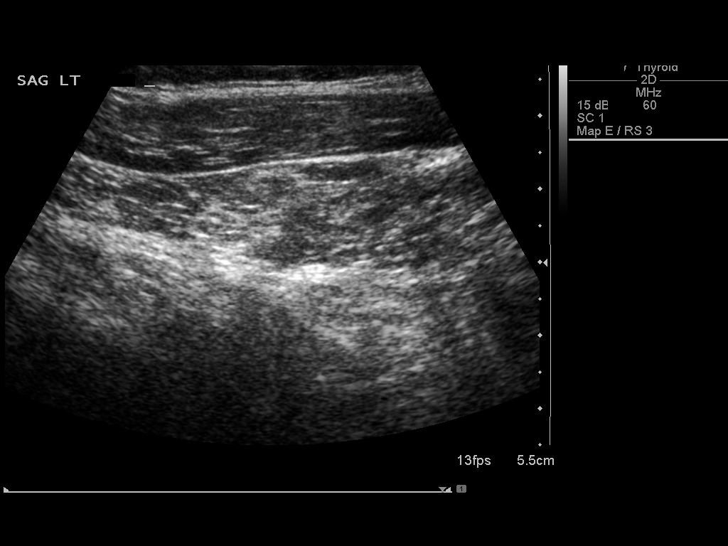
[im 35/35]
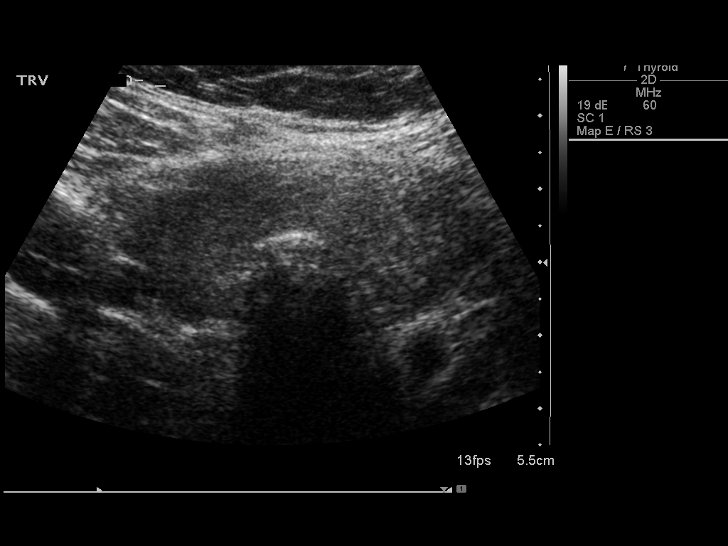

[14 of 25 positions shown; findings below may reference images not displayed]

FINDINGS: Right thyroid lobe

Measurements: 4.3 x 1.9 x 2.3 cm. Stable homogeneous hypoechoic
echotexture. Stable echogenic 2 mm nodule in the right upper pole
compared to the prior study. No new abnormality.

Left thyroid lobe

Measurements: 4.1 x 1.5 x 2.2 cm.  No nodules visualized.

Isthmus

Thickness: 6 mm.  No nodules visualized.

Lymphadenopathy

None visualized.
IMPRESSION: Stable tiny echogenic nodule measuring 2 mm in the right upper pole.
Stability of the nodule since 03/24/2011 supports a benign process.
No further imaging warranted. Otherwise stable thyroid ultrasound.

## 2016-05-18 DIAGNOSIS — J069 Acute upper respiratory infection, unspecified: Secondary | ICD-10-CM | POA: Diagnosis not present

## 2016-08-11 DIAGNOSIS — L02412 Cutaneous abscess of left axilla: Secondary | ICD-10-CM | POA: Diagnosis not present

## 2016-08-11 DIAGNOSIS — L02419 Cutaneous abscess of limb, unspecified: Secondary | ICD-10-CM | POA: Diagnosis not present

## 2016-08-13 DIAGNOSIS — R7301 Impaired fasting glucose: Secondary | ICD-10-CM | POA: Diagnosis not present

## 2016-08-13 DIAGNOSIS — L02412 Cutaneous abscess of left axilla: Secondary | ICD-10-CM | POA: Diagnosis not present

## 2016-08-13 DIAGNOSIS — L0291 Cutaneous abscess, unspecified: Secondary | ICD-10-CM | POA: Diagnosis not present

## 2016-08-14 DIAGNOSIS — I10 Essential (primary) hypertension: Secondary | ICD-10-CM | POA: Diagnosis not present

## 2016-08-14 DIAGNOSIS — E119 Type 2 diabetes mellitus without complications: Secondary | ICD-10-CM | POA: Diagnosis not present

## 2016-09-01 DIAGNOSIS — L02412 Cutaneous abscess of left axilla: Secondary | ICD-10-CM | POA: Diagnosis not present

## 2016-09-01 DIAGNOSIS — L732 Hidradenitis suppurativa: Secondary | ICD-10-CM | POA: Diagnosis not present

## 2016-09-04 ENCOUNTER — Telehealth: Payer: Self-pay

## 2016-09-05 MED FILL — SULFAMETHOXAZOLE/TMP DS TAB: 800-160 | 7 days supply | Qty: 14 | Fill #0

## 2016-09-08 ENCOUNTER — Other Ambulatory Visit: Payer: Self-pay

## 2016-09-08 DIAGNOSIS — E119 Type 2 diabetes mellitus without complications: Secondary | ICD-10-CM

## 2016-09-08 DIAGNOSIS — E282 Polycystic ovarian syndrome: Secondary | ICD-10-CM | POA: Insufficient documentation

## 2016-09-08 NOTE — Patient Outreach (Signed)
[facility]) Care Management   09/08/2016  PHYLLICIA DUDEK 07-21-1965 [id number]  Priscilla Webb is an 51 y.o. female.  Member seen for initial office visit for Link to Wellness program for self management of Type 2 diabetes  Subjective: Member states that she had been prediabetic for years and has a hx of polycystic ovairy syndrome.  States when she saw her provider in April her hemoglobin A1C had gone up to 6.5% and she started her on Metformin.  States she has been trying to eat better but she has not lost any weight.  States she tries to walk her dog 3 times a week.  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  States that her provider told her she does not need to check blood sugars.    Objective:   Review of Systems  All other systems reviewed and are negative.   Physical Exam Today's Vitals   09/08/16 1321 09/08/16 1325  BP: 122/80   Pulse: 86   Resp: 16   SpO2: 94%   Weight: 299 lb 9.6 oz (135.9 kg)   Height: 1.549 m (5\' 1" )   PainSc: 0-No pain 0-No pain   Encounter Medications:   Outpatient Encounter Prescriptions as of 09/08/2016  Medication Sig  . cholecalciferol (VITAMIN D) 1000 UNITS tablet Take 1,000 Units by mouth daily.   Marland Kitchen ibuprofen (ADVIL,MOTRIN) 200 MG tablet Take 200-400 mg by mouth every 6 (six) hours as needed. headache  . metFORMIN (GLUCOPHAGE-XR) 500 MG 24 hr tablet Take 500 mg by mouth daily before supper.  . Multiple Vitamin (MULTIVITAMIN WITH MINERALS) TABS Take 1 tablet by mouth daily.  Marland Kitchen GLUCOS-CHON-MSM-CA-C-CTCL-SECU PO Take by mouth.  . [DISCONTINUED] HYDROcodone-acetaminophen (NORCO/VICODIN) 5-325 MG per tablet Take 1-2 tablets by mouth every 4 (four) hours as needed for moderate pain or severe pain. (Patient not taking: Reported on 09/08/2016)  . [DISCONTINUED] Vitamin D, Ergocalciferol, (DRISDOL) 50000 UNITS CAPS capsule Take 50,000 Units by mouth 2 (two) times a week.   No facility-administered  encounter medications on file as of 09/08/2016.     Functional Status:   In your present state of health, do you have any difficulty performing the following activities: 09/08/2016  Hearing? N  Vision? N  Difficulty concentrating or making decisions? N  Walking or climbing stairs? N  Dressing or bathing? N  Doing errands, shopping? N  Some recent data might be hidden    Fall/Depression Screening:    Fall Risk  09/08/2016 01/17/2015  Falls in the past year? No No   PHQ 2/9 Scores 09/08/2016 01/17/2015  PHQ - 2 Score 0 0    Assessment:  Member seen for initial office visit for Link to Wellness program for self management of Type 2 diabetes.  Member is not meeting diabetes self management goal of hemoglobin A1C 6.4% or less with last reading of 6.5%.  Member has not had diabetes self management education and [facility].  Member is not checking CBGs per MD direction.  Member has gone to bariatric information class but does not want to have surgery at this time.  Member will look into weight loss resources at [facility] health. Member is past due for annual eye exam and dental check ups.  Plan:  Plan to attend Diabetes education classes at [facility].  They will call you to schedule or call them at [phone]  Plan to eat 30-45 GM of carbohydrate each meal and 15 GM for snacks. Plan to eat protein with snacks Plan to go to gym 3 times a week for 45-60 minutes Plan to schedule eye and dental exam Plan to complete EMMI programs by 11/18/16 Plan to see provider 11/12/16 Plan to return to Link to Wellness on 12/09/16 on 10AM  [facility] CM Care Plan Problem One     Most Recent Value  Care Plan Problem One  Potential for elevated blood sugars as evidenced by hemoglobin A1C of 6.5% related to dx of Type 2 DM   Role Documenting the Problem One  Care Management Coordinator  Care Plan for Problem One  Active  [facility] Long  Term Goal (31-90 days)  Member will decrease hemoglobin A1C to below 6.5% in the next 90 days  [facility] Long Term Goal Start Date  09/08/16  Interventions for Problem One Long Term Goal  Given Link to Wellness diabetes teaching packet, Instructed on CHO counting and portion control, Instructed on how weight loss can effect her diabetes, Given written information on [facility] Weight management, Instructed on how exercise effects glycemic control and encouraged to get back to going to gym 3 times a week, Instructed on use of Metformin and to take with food to help with possible GI upset     Peter Garter RN, [facility] Care Management Coordinator-Link to [place] [facility] [phone]

## 2016-09-08 NOTE — Patient Instructions (Signed)
1. Plan to attend Diabetes education classes at [facility].  They will call you to schedule or call them at [phone] 2. Plan to eat 30-45 GM of carbohydrate each meal and 15 GM for snacks. Plan to eat protein with snacks 3. Plan to go to gym 3 times a week for 45-60 minutes 4. Plan to schedule eye and dental exam 5. Plan to complete EMMI programs by 11/18/16 6. Plan to see provider 11/12/16 7. Plan to return to Link to Wellness on 12/09/16 on 10AM

## 2016-09-11 MED FILL — METFORMIN HCL ER 500 MG TAB: 500 | 90 days supply | Qty: 90 | Fill #0

## 2016-10-20 DIAGNOSIS — E119 Type 2 diabetes mellitus without complications: Secondary | ICD-10-CM | POA: Diagnosis not present

## 2016-10-20 DIAGNOSIS — Z7984 Long term (current) use of oral hypoglycemic drugs: Secondary | ICD-10-CM | POA: Diagnosis not present

## 2016-10-20 DIAGNOSIS — L02412 Cutaneous abscess of left axilla: Secondary | ICD-10-CM | POA: Diagnosis not present

## 2016-10-20 MED FILL — SULFAMETHOXAZOLE/TMP DS TAB: 800-160 | 7 days supply | Qty: 14 | Fill #0

## 2016-10-20 MED FILL — BENAZEPRIL HCL 10 MG TABLET: 10 | 30 days supply | Qty: 30 | Fill #0

## 2016-11-13 DIAGNOSIS — Z7984 Long term (current) use of oral hypoglycemic drugs: Secondary | ICD-10-CM | POA: Diagnosis not present

## 2016-11-13 DIAGNOSIS — I10 Essential (primary) hypertension: Secondary | ICD-10-CM | POA: Diagnosis not present

## 2016-11-13 DIAGNOSIS — L732 Hidradenitis suppurativa: Secondary | ICD-10-CM | POA: Diagnosis not present

## 2016-11-13 DIAGNOSIS — E119 Type 2 diabetes mellitus without complications: Secondary | ICD-10-CM | POA: Diagnosis not present

## 2016-11-13 MED FILL — BENAZEPRIL HCL 10 MG TABLET: 10 | 90 days supply | Qty: 90 | Fill #0

## 2016-11-13 MED FILL — SULFAMETHOXAZOLE/TMP DS TAB: 800-160 | 7 days supply | Qty: 14 | Fill #0

## 2016-11-14 DIAGNOSIS — E119 Type 2 diabetes mellitus without complications: Secondary | ICD-10-CM | POA: Diagnosis not present

## 2016-11-14 DIAGNOSIS — I10 Essential (primary) hypertension: Secondary | ICD-10-CM | POA: Diagnosis not present

## 2016-11-14 DIAGNOSIS — Z7984 Long term (current) use of oral hypoglycemic drugs: Secondary | ICD-10-CM | POA: Diagnosis not present

## 2016-11-14 DIAGNOSIS — L732 Hidradenitis suppurativa: Secondary | ICD-10-CM | POA: Diagnosis not present

## 2016-12-09 ENCOUNTER — Ambulatory Visit: Payer: Self-pay

## 2016-12-25 MED FILL — SULFAMETHOXAZOLE/TMP DS TAB: 800-160 | 7 days supply | Qty: 14 | Fill #0

## 2016-12-26 MED FILL — METFORMIN HCL ER 500 MG TAB: 500 | 60 days supply | Qty: 60 | Fill #1

## 2017-01-09 DIAGNOSIS — Z86010 Personal history of colonic polyps: Secondary | ICD-10-CM | POA: Diagnosis not present

## 2017-01-09 DIAGNOSIS — Z800 Family history of malignant neoplasm of digestive organs: Secondary | ICD-10-CM | POA: Diagnosis not present

## 2017-01-09 DIAGNOSIS — L732 Hidradenitis suppurativa: Secondary | ICD-10-CM | POA: Diagnosis not present

## 2017-01-09 DIAGNOSIS — E119 Type 2 diabetes mellitus without complications: Secondary | ICD-10-CM | POA: Diagnosis not present

## 2017-01-09 DIAGNOSIS — Z23 Encounter for immunization: Secondary | ICD-10-CM | POA: Diagnosis not present

## 2017-01-09 DIAGNOSIS — Z6841 Body Mass Index (BMI) 40.0 and over, adult: Secondary | ICD-10-CM | POA: Diagnosis not present

## 2017-01-09 DIAGNOSIS — I10 Essential (primary) hypertension: Secondary | ICD-10-CM | POA: Diagnosis not present

## 2017-01-09 DIAGNOSIS — Z0000 Encounter for general adult medical examination without abnormal findings: Secondary | ICD-10-CM | POA: Diagnosis not present

## 2017-01-09 MED FILL — ATORVASTATIN 10 MG TABLET: 10 | 90 days supply | Qty: 90 | Fill #0

## 2017-01-09 MED FILL — CEPHALEXIN 500 MG CAPSULE: 500 | 10 days supply | Qty: 20 | Fill #0

## 2017-01-28 DIAGNOSIS — L732 Hidradenitis suppurativa: Secondary | ICD-10-CM | POA: Diagnosis not present

## 2017-02-02 DIAGNOSIS — E119 Type 2 diabetes mellitus without complications: Secondary | ICD-10-CM | POA: Diagnosis not present

## 2017-02-02 DIAGNOSIS — I10 Essential (primary) hypertension: Secondary | ICD-10-CM | POA: Diagnosis not present

## 2017-02-02 DIAGNOSIS — L732 Hidradenitis suppurativa: Secondary | ICD-10-CM | POA: Diagnosis not present

## 2017-02-02 DIAGNOSIS — Z7984 Long term (current) use of oral hypoglycemic drugs: Secondary | ICD-10-CM | POA: Diagnosis not present

## 2017-03-20 DIAGNOSIS — D225 Melanocytic nevi of trunk: Secondary | ICD-10-CM | POA: Diagnosis not present

## 2017-03-20 DIAGNOSIS — L732 Hidradenitis suppurativa: Secondary | ICD-10-CM | POA: Diagnosis not present

## 2017-04-03 ENCOUNTER — Other Ambulatory Visit: Payer: Self-pay

## 2017-04-03 NOTE — Patient Outreach (Signed)
[name]) Care Management  04/03/2017  Priscilla Webb 1965-09-14 [id number]   Case closed in [place] Management will contact member in January to continue diabetes disease management. Case closed for Link to Wellness as member will be enrolled in an external program. Member and provider to be sent letter on transition to [place] RN, [facility] Care Management Coordinator-Link to [place] Management [phone]

## 2017-10-08 DIAGNOSIS — L732 Hidradenitis suppurativa: Secondary | ICD-10-CM | POA: Diagnosis not present

## 2017-10-12 DIAGNOSIS — Z79899 Other long term (current) drug therapy: Secondary | ICD-10-CM | POA: Diagnosis not present

## 2017-10-12 DIAGNOSIS — L732 Hidradenitis suppurativa: Secondary | ICD-10-CM | POA: Diagnosis not present

## 2017-11-02 ENCOUNTER — Ambulatory Visit (INDEPENDENT_AMBULATORY_CARE_PROVIDER_SITE_OTHER): Payer: 59 | Admitting: Pharmacist

## 2017-11-02 DIAGNOSIS — Z79899 Other long term (current) drug therapy: Secondary | ICD-10-CM

## 2017-11-02 MED ORDER — ADALIMUMAB 80 MG/0.8ML ~~LOC~~ AJKT: 4.0000 "pen " | AUTO-INJECTOR | Freq: Once | SUBCUTANEOUS | 0 refills | Status: DC

## 2017-11-02 MED ORDER — ADALIMUMAB 40 MG/0.8ML ~~LOC~~ AJKT: 4.0000 "pen " | AUTO-INJECTOR | Freq: Once | SUBCUTANEOUS | 0 refills | Status: DC

## 2017-11-02 MED ORDER — ADALIMUMAB 40 MG/0.8ML ~~LOC~~ AJKT: 40.0000 mg | AUTO-INJECTOR | SUBCUTANEOUS | 0 refills | Status: DC

## 2017-11-02 MED FILL — HUMIRA PEN CROHN-UC-HS STAR: 40 | 28 days supply | Qty: 6 | Fill #0

## 2017-11-02 NOTE — Addendum Note (Signed)
Addended by: Rica Mast on: 11/02/2017 03:37 PM   Modules accepted: Orders

## 2017-11-02 NOTE — Progress Notes (Signed)
   S: Patient presents to [facility] [place] for review of their specialty medication therapy. Her husband comes with her to the visit.   Patient is currently taking Humira for hidradenitis suppurativa. Patient is managed by [employer] for this. Patient reports that she has reviewed a packet of information about Humira.  Adherence: has not started taking yet  Efficacy: has not started taking yet. Currently taking Bactrim but it is not working for her so she is anxious to try the Humira.  Dosing:  Hidradenitis suppurativa (Humira only): SubQ: Initial: 160 mg (given as four 40 mg injections on day 1 or given as two 40 mg injections per day over 2 consecutive days), then 80 mg 2 weeks later (day 15). Maintenance: 40 mg every week beginning day 29.   Dose adjustments: Renal: no dose adjustments (has not been studied) Hepatic: no dose adjustments (has not been studied)  Screening: TB test: negative per patient Hepatitis: completed per patient  Monitoring: S/sx of infection: denies CBC: WNL per patient, going to PCP soon and will request new lab work.  S/sx of hypersensitivity: has not started yet S/sx of malignancy: denies S/sx of heart failure: denies  O:     Lab Results  Component Value Date   WBC 7.3 01/30/2014   HGB 15.0 02/02/2014   HCT 42.3 01/30/2014   MCV 92.0 01/30/2014   PLT 252 01/30/2014      Chemistry      Component Value Date/Time   NA 138 01/30/2014 0830   K 4.2 01/30/2014 0830   CL 97 01/30/2014 0830   CO2 26 01/30/2014 0830   BUN 12 01/30/2014 0830   CREATININE 0.59 01/30/2014 0830      Component Value Date/Time   CALCIUM 8.9 01/30/2014 0830   ALKPHOS 76 11/29/2011 0250   AST 19 11/29/2011 0250   ALT 26 11/29/2011 0250   BILITOT 0.3 11/29/2011 0250       A/P: 1. Medication review: Patient currently on Humira for hidradenitis suppurativa. Reviewed the medication with the patient, including the following: Humira is a TNF blocking agent  indicated for ankylosing spondylitis, Crohn's disease, Hidradenitis suppurativa, psoriatic arthritis, plaque psoriasis, ulcerative colitis, and uveitis. Patient educated on purpose, proper use and potential adverse effects of Humira. The most common adverse effects are infections, headache, and injection site reactions. There is the possibility of an increased risk of malignancy but it is not well understood if this increased risk is due to there medication or the disease state. There are rare cases of pancytopenia and aplastic anemia. Encouraged patient to have regular lab work and to be seen regularly by dermatologist. No recommendations for any changes at this time.   Christella Hartigan, PharmD, BCPS, BCACP, CPP Clinical Pharmacist Practitioner  [phone]

## 2017-11-06 DIAGNOSIS — L732 Hidradenitis suppurativa: Secondary | ICD-10-CM | POA: Diagnosis not present

## 2017-11-27 MED FILL — HUMIRA PEN 40 MG/0.8ML PNKT: 40 | 28 days supply | Qty: 4 | Fill #0

## 2017-12-30 ENCOUNTER — Other Ambulatory Visit: Payer: Self-pay | Admitting: Pharmacist

## 2017-12-30 MED ORDER — ADALIMUMAB 40 MG/0.8ML ~~LOC~~ AJKT: 40.0000 mg | AUTO-INJECTOR | SUBCUTANEOUS | 3 refills | Status: DC

## 2017-12-30 MED FILL — HUMIRA PEN 40 MG/0.8ML PNKT: 40 | 28 days supply | Qty: 4 | Fill #0

## 2018-01-22 DIAGNOSIS — Z0000 Encounter for general adult medical examination without abnormal findings: Secondary | ICD-10-CM | POA: Diagnosis not present

## 2018-01-22 DIAGNOSIS — I10 Essential (primary) hypertension: Secondary | ICD-10-CM | POA: Diagnosis not present

## 2018-01-22 DIAGNOSIS — R5383 Other fatigue: Secondary | ICD-10-CM | POA: Diagnosis not present

## 2018-01-22 DIAGNOSIS — E785 Hyperlipidemia, unspecified: Secondary | ICD-10-CM | POA: Diagnosis not present

## 2018-01-22 DIAGNOSIS — Z86010 Personal history of colonic polyps: Secondary | ICD-10-CM | POA: Diagnosis not present

## 2018-01-22 DIAGNOSIS — E559 Vitamin D deficiency, unspecified: Secondary | ICD-10-CM | POA: Diagnosis not present

## 2018-01-22 DIAGNOSIS — Z6841 Body Mass Index (BMI) 40.0 and over, adult: Secondary | ICD-10-CM | POA: Diagnosis not present

## 2018-01-22 DIAGNOSIS — E1169 Type 2 diabetes mellitus with other specified complication: Secondary | ICD-10-CM | POA: Diagnosis not present

## 2018-01-22 DIAGNOSIS — E041 Nontoxic single thyroid nodule: Secondary | ICD-10-CM | POA: Diagnosis not present

## 2018-01-25 MED FILL — HUMIRA PEN 40 MG/0.8ML PNKT: 40 | 28 days supply | Qty: 4 | Fill #1

## 2018-01-27 MED FILL — metFORMIN HCL 500 MG TABS: 500 | 90 days supply | Qty: 180 | Fill #0

## 2018-01-27 MED FILL — VIT D2 1.25 MG (50,000 UNIT: 1.25 MG | 84 days supply | Qty: 24 | Fill #0

## 2018-01-27 MED FILL — ATORVASTATIN CALCIUM 20 MG: 20 | 90 days supply | Qty: 90 | Fill #0

## 2018-02-23 MED FILL — HUMIRA PEN 40 MG/0.8ML PNKT: 40 | 28 days supply | Qty: 4 | Fill #2

## 2018-03-24 MED FILL — HUMIRA PEN 40 MG/0.8ML PNKT: 40 | 28 days supply | Qty: 4 | Fill #3

## 2018-03-29 DIAGNOSIS — L732 Hidradenitis suppurativa: Secondary | ICD-10-CM | POA: Diagnosis not present

## 2018-03-29 MED FILL — MINOCYCLINE 100 MG CAPSULE: 100 | 30 days supply | Qty: 60 | Fill #0

## 2018-04-26 ENCOUNTER — Other Ambulatory Visit: Payer: Self-pay | Admitting: Pharmacist

## 2018-04-26 DIAGNOSIS — E559 Vitamin D deficiency, unspecified: Secondary | ICD-10-CM | POA: Diagnosis not present

## 2018-04-26 DIAGNOSIS — E1169 Type 2 diabetes mellitus with other specified complication: Secondary | ICD-10-CM | POA: Diagnosis not present

## 2018-04-26 DIAGNOSIS — Z6841 Body Mass Index (BMI) 40.0 and over, adult: Secondary | ICD-10-CM | POA: Diagnosis not present

## 2018-04-26 DIAGNOSIS — E785 Hyperlipidemia, unspecified: Secondary | ICD-10-CM | POA: Diagnosis not present

## 2018-04-26 DIAGNOSIS — L732 Hidradenitis suppurativa: Secondary | ICD-10-CM | POA: Diagnosis not present

## 2018-04-26 MED ORDER — ADALIMUMAB 40 MG/0.8ML ~~LOC~~ AJKT: 40.0000 mg | AUTO-INJECTOR | SUBCUTANEOUS | 0 refills | Status: DC

## 2018-04-29 MED FILL — VIT D2 1.25 MG (50,000 UNIT: 1.25 MG | 84 days supply | Qty: 24 | Fill #0

## 2018-05-06 MED FILL — HUMIRA PEN 40 MG/0.8ML PNKT: 40 | 28 days supply | Qty: 4 | Fill #0

## 2018-05-10 MED FILL — ATORVASTATIN CALCIUM 20 MG: 20 | 90 days supply | Qty: 90 | Fill #1

## 2018-06-07 ENCOUNTER — Other Ambulatory Visit: Payer: Self-pay | Admitting: Internal Medicine

## 2018-06-07 DIAGNOSIS — L732 Hidradenitis suppurativa: Secondary | ICD-10-CM | POA: Diagnosis not present

## 2018-06-07 MED FILL — CLINDAMYCIN PHOSP 1% LOTION: 1 | 30 days supply | Qty: 60 | Fill #0

## 2018-06-07 MED FILL — SPIRONOLACTONE 50 MG TABLET: 50 | 30 days supply | Qty: 30 | Fill #0

## 2018-06-07 MED FILL — MINOCYCLINE 100 MG CAPSULE: 100 | 30 days supply | Qty: 60 | Fill #1

## 2018-06-08 ENCOUNTER — Other Ambulatory Visit: Payer: Self-pay | Admitting: Pharmacist

## 2018-06-08 MED ORDER — ADALIMUMAB 40 MG/0.8ML ~~LOC~~ AJKT: 40.0000 mg | AUTO-INJECTOR | SUBCUTANEOUS | 3 refills | Status: DC

## 2018-06-08 MED FILL — HUMIRA PEN 40 MG/0.8ML PNKT: 40 | 28 days supply | Qty: 4 | Fill #0

## 2018-07-01 MED FILL — CLINDAMYCIN PH 1% SOLUTION: 1 | 30 days supply | Qty: 60 | Fill #0

## 2018-07-02 MED FILL — SPIRONOLACTONE 50 MG TABLET: 50 | 30 days supply | Qty: 30 | Fill #1 | Status: TO

## 2018-07-02 MED FILL — metFORMIN HCL 500 MG TABS: 500 | 90 days supply | Qty: 180 | Fill #1

## 2018-07-05 MED FILL — VIT D2 1.25 MG (50,000 UNIT: 1.25 MG | 84 days supply | Qty: 24 | Fill #1

## 2018-07-06 MED FILL — HUMIRA PEN 40 MG/0.8ML PNKT: 40 | 28 days supply | Qty: 4 | Fill #1

## 2018-07-29 MED FILL — HUMIRA PEN 40 MG/0.8ML PNKT: 40 | 28 days supply | Qty: 4 | Fill #2

## 2018-07-29 MED FILL — ATORVASTATIN 20 MG TABLET: 20 | 90 days supply | Qty: 90 | Fill #0

## 2018-07-29 MED FILL — SPIRONOLACTONE 50 MG TABS: 50 | 30 days supply | Qty: 30 | Fill #0

## 2018-08-04 DIAGNOSIS — L732 Hidradenitis suppurativa: Secondary | ICD-10-CM | POA: Diagnosis not present

## 2018-08-04 DIAGNOSIS — L304 Erythema intertrigo: Secondary | ICD-10-CM | POA: Diagnosis not present

## 2018-08-23 MED FILL — HUMIRA PEN 40 MG/0.8ML PNKT: 40 | 28 days supply | Qty: 4 | Fill #3

## 2018-08-28 MED FILL — SPIRONOLACTONE 50 MG TABS: 50 | 30 days supply | Qty: 30 | Fill #1

## 2018-09-22 MED FILL — HUMIRA PEN 40 MG/0.8ML PNKT: 40 | 28 days supply | Qty: 4 | Fill #0

## 2018-09-29 MED FILL — SPIRONOLACTONE 50 MG TABS: 50 | 30 days supply | Qty: 60 | Fill #0

## 2018-10-11 DIAGNOSIS — L732 Hidradenitis suppurativa: Secondary | ICD-10-CM | POA: Diagnosis not present

## 2018-10-15 MED FILL — SPIRONOLACTONE 50 MG TABS: 50 | 30 days supply | Qty: 90 | Fill #0

## 2018-10-20 ENCOUNTER — Ambulatory Visit: Payer: 59 | Attending: Family Medicine | Admitting: Pharmacist

## 2018-10-20 ENCOUNTER — Other Ambulatory Visit: Payer: Self-pay

## 2018-10-20 DIAGNOSIS — Z79899 Other long term (current) drug therapy: Secondary | ICD-10-CM

## 2018-10-20 NOTE — Progress Notes (Signed)
   S: Patient presents for review of their specialty medication therapy.  Patient is currently taking Humira for hidradenitis suppurativa. Patient is managed by [employer] for this. She reports that she recently stopped Humira d/t to inefficacy. She states that she is being referred to a specialist at [facility].   Adherence: stopped; last injection last week  Efficacy: per patient, Humira is not working.   Dosing:  Hidradenitis suppurativa (Humira only): SubQ: Maintenance: 40 mg every week beginning day 29.   Dose adjustments: Renal: no dose adjustments (has not been studied) Hepatic: no dose adjustments (has not been studied)  Screening: TB test: negative per patient Hepatitis: completed per patient  Monitoring: S/sx of infection: denies CBC: WNL per patient, going to PCP soon and will request new lab work.  S/sx of hypersensitivity: has not started yet S/sx of malignancy: denies S/sx of heart failure: denies  O:  Lab Results  Component Value Date   WBC 7.3 01/30/2014   HGB 15.0 02/02/2014   HCT 42.3 01/30/2014   MCV 92.0 01/30/2014   PLT 252 01/30/2014      Chemistry      Component Value Date/Time   NA 138 01/30/2014 0830   K 4.2 01/30/2014 0830   CL 97 01/30/2014 0830   CO2 26 01/30/2014 0830   BUN 12 01/30/2014 0830   CREATININE 0.59 01/30/2014 0830      Component Value Date/Time   CALCIUM 8.9 01/30/2014 0830   ALKPHOS 76 11/29/2011 0250   AST 19 11/29/2011 0250   ALT 26 11/29/2011 0250   BILITOT 0.3 11/29/2011 0250       A/P: 1. Medication review: Patient is not currently taking Humira for hidradenitis suppurativa.  Encouraged patient to reach out to [place] with any changes or updates in therapy. No additional recommendations at this time.   Benard Halsted, PharmD, [place] [phone]

## 2018-10-22 MED FILL — ATORVASTATIN 20 MG TABLET: 20 | 90 days supply | Qty: 90 | Fill #1

## 2018-10-25 DIAGNOSIS — L732 Hidradenitis suppurativa: Secondary | ICD-10-CM | POA: Diagnosis not present

## 2018-10-25 DIAGNOSIS — E1169 Type 2 diabetes mellitus with other specified complication: Secondary | ICD-10-CM | POA: Diagnosis not present

## 2018-10-25 DIAGNOSIS — E785 Hyperlipidemia, unspecified: Secondary | ICD-10-CM | POA: Diagnosis not present

## 2018-10-25 DIAGNOSIS — Z800 Family history of malignant neoplasm of digestive organs: Secondary | ICD-10-CM | POA: Diagnosis not present

## 2018-10-25 DIAGNOSIS — E559 Vitamin D deficiency, unspecified: Secondary | ICD-10-CM | POA: Diagnosis not present

## 2018-10-25 MED FILL — VIT D2 1.25 MG (50,000 UNIT: 1.25 MG | 84 days supply | Qty: 24 | Fill #0

## 2018-10-26 DIAGNOSIS — L732 Hidradenitis suppurativa: Secondary | ICD-10-CM | POA: Diagnosis not present

## 2018-10-26 DIAGNOSIS — Z79899 Other long term (current) drug therapy: Secondary | ICD-10-CM | POA: Diagnosis not present

## 2018-11-01 DIAGNOSIS — L988 Other specified disorders of the skin and subcutaneous tissue: Secondary | ICD-10-CM | POA: Diagnosis not present

## 2018-11-01 DIAGNOSIS — L732 Hidradenitis suppurativa: Secondary | ICD-10-CM | POA: Diagnosis not present

## 2018-11-01 DIAGNOSIS — L810 Postinflammatory hyperpigmentation: Secondary | ICD-10-CM | POA: Diagnosis not present

## 2018-11-01 DIAGNOSIS — L304 Erythema intertrigo: Secondary | ICD-10-CM | POA: Diagnosis not present

## 2018-11-01 MED FILL — SILVADENE 1% CREAM: 1 | 30 days supply | Qty: 400 | Fill #0

## 2018-11-01 MED FILL — rifAMPin 300 MG CAPS: 300 | 30 days supply | Qty: 60 | Fill #0

## 2018-11-01 MED FILL — MOXIFLOXACIN HCL 400 MG TAB: 400 | 30 days supply | Qty: 30 | Fill #0

## 2018-11-01 MED FILL — METRONIDAZOLE 500 MG TABS: 500 | 10 days supply | Qty: 30 | Fill #0

## 2018-11-26 MED FILL — SILVADENE 1% CREAM: 1 | 30 days supply | Qty: 400 | Fill #1

## 2018-12-06 DIAGNOSIS — Z5181 Encounter for therapeutic drug level monitoring: Secondary | ICD-10-CM | POA: Diagnosis not present

## 2018-12-06 DIAGNOSIS — Z79899 Other long term (current) drug therapy: Secondary | ICD-10-CM | POA: Diagnosis not present

## 2018-12-06 DIAGNOSIS — L732 Hidradenitis suppurativa: Secondary | ICD-10-CM | POA: Diagnosis not present

## 2018-12-06 DIAGNOSIS — L709 Acne, unspecified: Secondary | ICD-10-CM | POA: Diagnosis not present

## 2018-12-06 MED FILL — SPIRONOLACTONE 50 MG TABS: 50 | 30 days supply | Qty: 60 | Fill #0

## 2018-12-23 MED FILL — DAPSONE 25 MG TABS: 25 | 30 days supply | Qty: 60 | Fill #0

## 2018-12-30 MED FILL — SILVADENE 1% CREAM: 1 | 30 days supply | Qty: 400 | Fill #2

## 2019-01-07 MED FILL — SPIRONOLACTONE 50 MG TABS: 50 | 30 days supply | Qty: 60 | Fill #1

## 2019-01-14 MED FILL — metFORMIN HCL 500 MG TABS: 500 | 90 days supply | Qty: 90 | Fill #0

## 2019-01-26 DIAGNOSIS — E559 Vitamin D deficiency, unspecified: Secondary | ICD-10-CM | POA: Diagnosis not present

## 2019-01-26 DIAGNOSIS — E1169 Type 2 diabetes mellitus with other specified complication: Secondary | ICD-10-CM | POA: Diagnosis not present

## 2019-01-26 DIAGNOSIS — Z800 Family history of malignant neoplasm of digestive organs: Secondary | ICD-10-CM | POA: Diagnosis not present

## 2019-01-26 DIAGNOSIS — E785 Hyperlipidemia, unspecified: Secondary | ICD-10-CM | POA: Diagnosis not present

## 2019-01-26 DIAGNOSIS — Z86010 Personal history of colonic polyps: Secondary | ICD-10-CM | POA: Diagnosis not present

## 2019-01-26 DIAGNOSIS — Z0000 Encounter for general adult medical examination without abnormal findings: Secondary | ICD-10-CM | POA: Diagnosis not present

## 2019-01-26 DIAGNOSIS — Z1231 Encounter for screening mammogram for malignant neoplasm of breast: Secondary | ICD-10-CM | POA: Diagnosis not present

## 2019-01-26 DIAGNOSIS — Z6841 Body Mass Index (BMI) 40.0 and over, adult: Secondary | ICD-10-CM | POA: Diagnosis not present

## 2019-01-28 MED FILL — metFORMIN HCL 500 MG TABS: 500 | 90 days supply | Qty: 90 | Fill #0

## 2019-01-31 MED FILL — ATORVASTATIN 20 MG TABLET: 20 | 90 days supply | Qty: 90 | Fill #0

## 2019-02-16 MED FILL — DAPSONE 25 MG TABS: 25 | 30 days supply | Qty: 60 | Fill #2

## 2019-02-16 MED FILL — SILVADENE 1% CREAM: 1 | 30 days supply | Qty: 400 | Fill #3

## 2019-02-16 MED FILL — SPIRONOLACTONE 50 MG TABS: 50 | 30 days supply | Qty: 60 | Fill #2

## 2019-02-17 DIAGNOSIS — Z1231 Encounter for screening mammogram for malignant neoplasm of breast: Secondary | ICD-10-CM | POA: Diagnosis not present

## 2019-02-24 ENCOUNTER — Other Ambulatory Visit: Payer: Self-pay | Admitting: Gastroenterology

## 2019-03-01 DIAGNOSIS — Z86010 Personal history of colonic polyps: Secondary | ICD-10-CM | POA: Diagnosis not present

## 2019-03-01 DIAGNOSIS — Z6841 Body Mass Index (BMI) 40.0 and over, adult: Secondary | ICD-10-CM | POA: Diagnosis not present

## 2019-03-02 ENCOUNTER — Other Ambulatory Visit: Payer: Self-pay | Admitting: Gastroenterology

## 2019-03-10 MED FILL — PEG 3350-ELECTROLYTE SOLN: 420 | 1 days supply | Qty: 4000 | Fill #0

## 2019-03-22 MED FILL — DAPSONE 25 MG TABS: 25 | 30 days supply | Qty: 60 | Fill #3

## 2019-03-28 DIAGNOSIS — L732 Hidradenitis suppurativa: Secondary | ICD-10-CM | POA: Diagnosis not present

## 2019-03-28 MED FILL — FLUCONAZOLE 100 MG TAB: 100 | 7 days supply | Qty: 7 | Fill #0

## 2019-03-28 MED FILL — SILVADENE 1% CREAM: 1 | 30 days supply | Qty: 400 | Fill #0

## 2019-04-04 ENCOUNTER — Other Ambulatory Visit (HOSPITAL_COMMUNITY)
Admission: RE | Admit: 2019-04-04 | Discharge: 2019-04-04 | Disposition: A | Discharge status: Home / Self Care | Payer: 59 | Source: Ambulatory Visit | Attending: Gastroenterology | Admitting: Gastroenterology

## 2019-04-04 DIAGNOSIS — Z20828 Contact with and (suspected) exposure to other viral communicable diseases: Secondary | ICD-10-CM | POA: Diagnosis not present

## 2019-04-04 DIAGNOSIS — Z01812 Encounter for preprocedural laboratory examination: Secondary | ICD-10-CM | POA: Diagnosis not present

## 2019-04-05 LAB — NOVEL CORONAVIRUS, NAA (HOSP ORDER, SEND-OUT TO REF LAB; TAT 18-24 HRS): SARS-CoV-2, NAA: NOT DETECTED

## 2019-04-06 ENCOUNTER — Encounter (HOSPITAL_COMMUNITY): Payer: Self-pay | Admitting: Gastroenterology

## 2019-04-06 NOTE — Anesthesia Preprocedure Evaluation (Addendum)
Anesthesia Evaluation  Patient identified by MRN, date of birth, ID band Patient awake    Reviewed: Allergy & Precautions, NPO status , Patient's Chart, lab work & pertinent test results  Airway Mallampati: III  TM Distance: >3 FB Neck ROM: Full    Dental no notable dental hx. (+) Dental Advisory Given   Pulmonary asthma ,    Pulmonary exam normal        Cardiovascular negative cardio ROS Normal cardiovascular exam     Neuro/Psych negative neurological ROS     GI/Hepatic negative GI ROS, Neg liver ROS,   Endo/Other  diabetes  Renal/GU negative Renal ROS     Musculoskeletal negative musculoskeletal ROS (+)   Abdominal   Peds  Hematology negative hematology ROS (+)   Anesthesia Other Findings Day of surgery medications reviewed with the patient.  Reproductive/Obstetrics                            Anesthesia Physical Anesthesia Plan  ASA: II  Anesthesia Plan: MAC   Post-op Pain Management:    Induction:   PONV Risk Score and Plan: Ondansetron and Propofol infusion  Airway Management Planned: Natural Airway  Additional Equipment:   Intra-op Plan:   Post-operative Plan:   Informed Consent: I have reviewed the patients History and Physical, chart, labs and discussed the procedure including the risks, benefits and alternatives for the proposed anesthesia with the patient or authorized representative who has indicated his/her understanding and acceptance.     Dental advisory given  Plan Discussed with: Anesthesiologist and CRNA  Anesthesia Plan Comments:        Anesthesia Quick Evaluation

## 2019-04-07 ENCOUNTER — Ambulatory Visit (HOSPITAL_COMMUNITY): Payer: 59 | Admitting: Anesthesiology

## 2019-04-07 ENCOUNTER — Encounter (HOSPITAL_COMMUNITY)
Admission: RE | Disposition: A | Discharge status: Home / Self Care | Payer: Self-pay | Source: Home / Self Care | Attending: Gastroenterology

## 2019-04-07 ENCOUNTER — Encounter (HOSPITAL_COMMUNITY): Payer: Self-pay | Admitting: Gastroenterology

## 2019-04-07 ENCOUNTER — Other Ambulatory Visit: Payer: Self-pay

## 2019-04-07 ENCOUNTER — Ambulatory Visit (HOSPITAL_COMMUNITY)
Admission: RE | Admit: 2019-04-07 | Discharge: 2019-04-07 | Disposition: A | Discharge status: Home / Self Care | Payer: 59 | Attending: Gastroenterology | Admitting: Gastroenterology

## 2019-04-07 DIAGNOSIS — D124 Benign neoplasm of descending colon: Secondary | ICD-10-CM | POA: Diagnosis not present

## 2019-04-07 DIAGNOSIS — Z1211 Encounter for screening for malignant neoplasm of colon: Secondary | ICD-10-CM | POA: Insufficient documentation

## 2019-04-07 DIAGNOSIS — Z6841 Body Mass Index (BMI) 40.0 and over, adult: Secondary | ICD-10-CM | POA: Insufficient documentation

## 2019-04-07 DIAGNOSIS — Z7984 Long term (current) use of oral hypoglycemic drugs: Secondary | ICD-10-CM | POA: Diagnosis not present

## 2019-04-07 DIAGNOSIS — E119 Type 2 diabetes mellitus without complications: Secondary | ICD-10-CM | POA: Diagnosis not present

## 2019-04-07 DIAGNOSIS — Z800 Family history of malignant neoplasm of digestive organs: Secondary | ICD-10-CM | POA: Diagnosis not present

## 2019-04-07 DIAGNOSIS — Z881 Allergy status to other antibiotic agents status: Secondary | ICD-10-CM | POA: Insufficient documentation

## 2019-04-07 DIAGNOSIS — K621 Rectal polyp: Secondary | ICD-10-CM | POA: Diagnosis not present

## 2019-04-07 DIAGNOSIS — Z91040 Latex allergy status: Secondary | ICD-10-CM | POA: Diagnosis not present

## 2019-04-07 DIAGNOSIS — Z86010 Personal history of colonic polyps: Secondary | ICD-10-CM | POA: Insufficient documentation

## 2019-04-07 DIAGNOSIS — Z79899 Other long term (current) drug therapy: Secondary | ICD-10-CM | POA: Insufficient documentation

## 2019-04-07 DIAGNOSIS — J45909 Unspecified asthma, uncomplicated: Secondary | ICD-10-CM | POA: Diagnosis not present

## 2019-04-07 DIAGNOSIS — Z880 Allergy status to penicillin: Secondary | ICD-10-CM | POA: Diagnosis not present

## 2019-04-07 DIAGNOSIS — D125 Benign neoplasm of sigmoid colon: Secondary | ICD-10-CM | POA: Insufficient documentation

## 2019-04-07 DIAGNOSIS — K5730 Diverticulosis of large intestine without perforation or abscess without bleeding: Secondary | ICD-10-CM | POA: Diagnosis not present

## 2019-04-07 DIAGNOSIS — K635 Polyp of colon: Secondary | ICD-10-CM | POA: Diagnosis not present

## 2019-04-07 HISTORY — PX: COLONOSCOPY WITH PROPOFOL: SHX5780

## 2019-04-07 HISTORY — PX: POLYPECTOMY: SHX5525

## 2019-04-07 HISTORY — PX: BIOPSY: SHX5522

## 2019-04-07 LAB — GLUCOSE, CAPILLARY: Glucose-Capillary: 151 mg/dL — ABNORMAL HIGH (ref 70–99)

## 2019-04-07 SURGERY — COLONOSCOPY WITH PROPOFOL
Anesthesia: Monitor Anesthesia Care

## 2019-04-07 MED ORDER — PROPOFOL 10 MG/ML IV BOLUS
INTRAVENOUS | Status: DC | PRN
  Administered 2019-04-07: 10 mg via INTRAVENOUS
  Administered 2019-04-07: 20 mg via INTRAVENOUS

## 2019-04-07 MED ORDER — PROPOFOL 500 MG/50ML IV EMUL
INTRAVENOUS | Status: AC
  Filled 2019-04-07: qty 50

## 2019-04-07 MED ORDER — LACTATED RINGERS IV SOLN
INTRAVENOUS | Status: DC
  Administered 2019-04-07: 1000 mL via INTRAVENOUS

## 2019-04-07 MED ORDER — ONDANSETRON HCL 4 MG/2ML IJ SOLN
INTRAMUSCULAR | Status: DC | PRN
  Administered 2019-04-07: 4 mg via INTRAVENOUS

## 2019-04-07 MED ORDER — SODIUM CHLORIDE 0.9 % IV SOLN: INTRAVENOUS | Status: DC

## 2019-04-07 MED ORDER — PROPOFOL 500 MG/50ML IV EMUL
INTRAVENOUS | Status: DC | PRN
  Administered 2019-04-07: 150 ug/kg/min via INTRAVENOUS

## 2019-04-07 SURGICAL SUPPLY — 22 items

## 2019-04-07 NOTE — Discharge Instructions (Signed)
YOU HAD AN ENDOSCOPIC PROCEDURE TODAY: Refer to the procedure report and other information in the discharge instructions given to you for any specific questions about what was found during the examination. If this information does not answer your questions, please call [facility] office at [phone] to clarify.  ? ?YOU SHOULD EXPECT: Some feelings of bloating in the abdomen. Passage of more gas than usual. Walking can help get rid of the air that was put into your GI tract during the procedure and reduce the bloating. If you had a lower endoscopy (such as a colonoscopy or flexible sigmoidoscopy) you may notice spotting of blood in your stool or on the toilet paper. Some abdominal soreness may be present for a day or two, also. ? ?DIET: Your first meal following the procedure should be a light meal and then it is ok to progress to your normal diet. A half-sandwich or bowl of soup is an example of a good first meal. Heavy or fried foods are harder to digest and may make you feel nauseous or bloated. Drink plenty of fluids but you should avoid alcoholic beverages for 24 hours.  ? ?ACTIVITY: Your care partner should take you home directly after the procedure. You should plan to take it easy, moving slowly for the rest of the day. You can resume normal activity the day after the procedure however YOU SHOULD NOT DRIVE, use power tools, machinery or perform tasks that involve climbing or major physical exertion for 24 hours (because of the sedation medicines used during the test).  ? ?SYMPTOMS TO REPORT IMMEDIATELY: ?A gastroenterologist can be reached at any hour. Please call [phone]  for any of the following symptoms:  ?Following lower endoscopy (colonoscopy, flexible sigmoidoscopy) ?Excessive amounts of blood in the stool  ?Significant tenderness, worsening of abdominal pains  ?Swelling of the abdomen that is new, acute  ?Fever of 100? or higher  ? ?FOLLOW UP:  ?If any biopsies were taken you will be contacted by  phone or by letter within the next 1-3 weeks. Call [phone]  if you have not heard about the biopsies in 3 weeks.  ?Please also call with any specific questions about appointments or follow up tests.  ?

## 2019-04-07 NOTE — Anesthesia Postprocedure Evaluation (Signed)
Anesthesia Post Note  Patient: Priscilla Webb  Procedure(s) Performed: COLONOSCOPY WITH PROPOFOL (N/A ) BIOPSY     Patient location during evaluation: Endoscopy Anesthesia Type: MAC Level of consciousness: awake and alert Pain management: pain level controlled Vital Signs Assessment: post-procedure vital signs reviewed and stable Respiratory status: spontaneous breathing and respiratory function stable Cardiovascular status: stable Postop Assessment: no apparent nausea or vomiting Anesthetic complications: no    Last Vitals:  Vitals:   04/07/19 0950 04/07/19 1000  BP: (!) 108/53 126/72  Pulse: 85 82  Resp: 16 13  Temp:    SpO2: 94% 96%    Last Pain:  Vitals:   04/07/19 1000  TempSrc:   PainSc: 0-No pain                 Ronit Marczak DANIEL

## 2019-04-07 NOTE — H&P (Signed)
Priscilla Webb is an 53 y.o. female.   Chief Complaint: History of advanced adenoma HPI: Asymptomatic morbidly obese female 68 yrs s/p colonoscopic removal of a very large (6cm) adenoma from sigmoid region, and 10 yrs s/p removal of a 5 cm polyp without subsequent advanced lesions on f/u exams.  Family hx pertinent for colon cancer in her father at age 73.  Now due for surveillance, last colonoscopy was 5 yrs ago.  Past Medical History:  Diagnosis Date  . Asthma   . Diabetes mellitus without complication [facility])     Past Surgical History:  Procedure Laterality Date  . COLONOSCOPY    . EAR CYST EXCISION Left 02/02/2014   Procedure: LEFT THIGH SEBACEOUS CYST EXCISION 2.8 X 2 CM;  Surgeon: Rolm Bookbinder, MD;  Location: [facility];  Service: General;  Laterality: Left;  . HYSTEROSCOPY W/D&C  06/11/1999   with exc. endometrial polyp  . TONSILLECTOMY    . TOTAL ABDOMINAL HYSTERECTOMY  10/08/2001    Family History  Problem Relation Age of Onset  . Cancer Father        colon  . Cancer Maternal Grandmother        breast  . Cancer Paternal Grandmother        non hodgkins lymphoma   Social History:  reports that she has never smoked. She has never used smokeless tobacco. She reports that she does not drink alcohol or use drugs.  Allergies:  Allergies  Allergen Reactions  . Bee Venom   . Doxycycline Nausea And Vomiting  . Latex Other (See Comments)    Skin irritation   . Penicillins Rash    Did it involve swelling of the face/tongue/throat, SOB, or low BP? Unknown Did it involve sudden or severe rash/hives, skin peeling, or any reaction on the inside of your mouth or nose? Unknown Did you need to seek medical attention at a hospital or doctor's office? Unknown When did it last happen?childhood If all above answers are "NO", may proceed with cephalosporin use.     Medications Prior to Admission  Medication Sig Dispense Refill  . atorvastatin (LIPITOR) 10 MG  tablet Take 10 mg by mouth daily.    . dapsone 25 MG tablet Take 25 mg by mouth daily.    Marland Kitchen ibuprofen (ADVIL,MOTRIN) 200 MG tablet Take 200-400 mg by mouth every 6 (six) hours as needed. headache    . metFORMIN (GLUCOPHAGE-XR) 500 MG 24 hr tablet Take 500 mg by mouth daily before supper.    . Multiple Vitamin (MULTIVITAMIN WITH MINERALS) TABS Take 1 tablet by mouth daily.    Marland Kitchen spironolactone (ALDACTONE) 50 MG tablet Take 50 mg by mouth 2 (two) times daily.      Results for orders placed or performed during the hospital encounter of 04/07/19 (from the past 48 hour(s))  Glucose, capillary     Status: Abnormal   Collection Time: 04/07/19  7:47 AM  Result Value Ref Range   Glucose-Capillary 151 (H) 70 - 99 mg/dL   No results found.  Review of Systems no new medical issues since seen by televisit 1 month ago  Height 5' (1.524 m), weight 116.9 kg. Physical Exam   Pleasant, morbidly obese, otherwise healthy-appearing, no pallor or icterus, chest clear, heart nl, abd nontender and without masses.  Cognitively intact, nl mood, no evident focal neuro deficits.  Assessment/Plan 1. History of advanced adenomas 2. Family history of colon cancer  For colonoscopy today.  Cleotis Nipper, MD 04/07/2019,  8:45 AM

## 2019-04-07 NOTE — Transfer of Care (Signed)
Immediate Anesthesia Transfer of Care Note  Patient: Priscilla Webb  Procedure(s) Performed: COLONOSCOPY WITH PROPOFOL (N/A ) BIOPSY  Patient Location: PACU and Endoscopy Unit  Anesthesia Type:MAC  Level of Consciousness: awake, alert  and oriented  Airway & Oxygen Therapy: Patient Spontanous Breathing and Patient connected to face mask oxygen  Post-op Assessment: Report given to RN and Post -op Vital signs reviewed and stable  Post vital signs: Reviewed and stable  Last Vitals:  Vitals Value Taken Time  BP 94/27 04/07/19 0932  Temp 35.9 C 04/07/19 0932  Pulse 85 04/07/19 0935  Resp 24 04/07/19 0935  SpO2 100 % 04/07/19 0935  Vitals shown include unvalidated device data.  Last Pain:  Vitals:   04/07/19 0932  TempSrc: Temporal  PainSc:          Complications: No apparent anesthesia complications

## 2019-04-07 NOTE — Anesthesia Procedure Notes (Signed)
Procedure Name: MAC Date/Time: 04/07/2019 8:47 AM Performed by: Maxwell Caul, CRNA Pre-anesthesia Checklist: Patient identified, Emergency Drugs available, Suction available and Patient being monitored Oxygen Delivery Method: Simple face mask

## 2019-04-07 NOTE — Op Note (Signed)
[facility] Patient Name: Priscilla Webb Procedure Date: 04/07/2019 MRN: [id number] Attending MD: Ronald Lobo , MD Date of Birth: Oct 01, 1965 CSN: [id number] Age: 53 Admit Type: Outpatient Procedure:                Colonoscopy Indications:              Screening in patient at increased risk: Colorectal                            cancer in father 12 or older, High risk colon                            cancer surveillance: Personal history of adenoma                            (10 mm or greater in size), Last colonoscopy: May                            2010 Providers:                Ronald Lobo, MD, Cleda Daub, RN, Cherylynn Ridges, Technician, Virgia Land, CRNA Referring MD:              Medicines:                Monitored Anesthesia Care Complications:            No immediate complications. Estimated Blood Loss:     Estimated blood loss was minimal. Procedure:                Pre-Anesthesia Assessment:                           - Prior to the procedure, a History and Physical                            was performed, and patient medications and                            allergies were reviewed. The patient's tolerance of                            previous anesthesia was also reviewed. The risks                            and benefits of the procedure and the sedation                            options and risks were discussed with the patient.                            All questions were answered, and informed consent  was obtained. Prior Anticoagulants: The patient has                            taken no previous anticoagulant or antiplatelet                            agents. ASA Grade Assessment: II - A patient with                            mild systemic disease. After reviewing the risks                            and benefits, the patient was deemed in                            satisfactory  condition to undergo the procedure.                           After obtaining informed consent, the colonoscope                            was passed under direct vision. Throughout the                            procedure, the patient's blood pressure, pulse, and                            oxygen saturations were monitored continuously. The                            [id number] Olympus colonoscope was                            introduced through the anus and advanced to the the                            terminal ileum. The colonoscopy was somewhat                            difficult due to restricted mobility of the colon.                            The patient tolerated the procedure well. The                            quality of the bowel preparation was excellent. Scope In: 8:57:21 AM Scope Out: 9:25:12 AM Scope Withdrawal Time: 0 hours 21 minutes 54 seconds  Total Procedure Duration: 0 hours 27 minutes 51 seconds  Findings:      The perianal and digital rectal examinations were normal.      A single medium-mouthed diverticulum was found in the sigmoid colon.      A 3 mm polyp was found in the descending colon. The polyp was sessile.       Biopsies were taken  with a cold forceps for histology. It appears       excision was complete by this method.      A 4 mm polyp was found in the sigmoid colon. The polyp was sessile. The       polyp was removed with a cold snare and touched up with a cold biopsy       forceps. Resection and retrieval were complete. Estimated blood loss was       minimal.      A 3 mm polyp was found in the sigmoid colon. The polyp was sessile.       Biopsies were taken with a cold forceps for histology.      Two sessile polyps were found in the distal rectum. The polyps were 4 mm       in size. These polyps were removed with a cold snare. Resection was       complete for both polyps, but one of them was not successfully       retrieved. Estimated blood  loss was minimal.      A tattoo was seen in the sigmoid region, corresponding to the site of       the patient's previous polypectomy, but careful inspection of the scar       area disclosed no evidence of recurrent polyp formation.      No other significant abnormalities were identified in a careful       examination of the remainder of the colon. The right colon was       reinspected.      The terminal ileum appeared normal.      Retroflexion could not be accomplished in the rectum due to a small       rectal ampulla, but careful antegrade viewing and reinspection disclosed       no additional abnormalities. Impression:               - Diverticulosis in the sigmoid colon.                           - One 3 mm polyp in the descending colon. Biopsied.                           - One 4 mm polyp in the sigmoid colon, removed with                            a cold snare. Resected and retrieved.                           - One 3 mm polyp in the sigmoid colon. Biopsied.                           - Two 4 mm polyps in the distal rectum, removed                            with a cold snare. Resected and retrieved.                           - Tattoo in sigmoid region without evidence of  local polyp recurrence.                           - The examined portion of the ileum was normal. Moderate Sedation:      This patient was sedated with monitored anesthesia care, not moderate       sedation. Recommendation:           - Await pathology results.                           - Repeat colonoscopy in 3 - 5 years for                            surveillance. Procedure Code(s):        --- Professional ---                           [phone], Colonoscopy, flexible; with removal of                            tumor(s), polyp(s), or other lesion(s) by snare                            technique                           45380, 29, Colonoscopy, flexible; with biopsy,                             single or multiple Diagnosis Code(s):        --- Professional ---                           Z80.0, Family history of malignant neoplasm of                            digestive organs                           Z86.010, Personal history of colonic polyps                           K63.5, Polyp of colon                           K62.1, Rectal polyp CPT copyright 2019 American Medical Association. All rights reserved. The codes documented in this report are preliminary and upon coder review may  be revised to meet current compliance requirements. Ronald Lobo, MD 04/07/2019 9:52:47 AM This report has been signed electronically. Number of Addenda: 0

## 2019-04-08 LAB — SURGICAL PATHOLOGY

## 2019-04-11 ENCOUNTER — Encounter: Payer: Self-pay | Admitting: *Deleted

## 2019-04-19 MED FILL — SPIRONOLACTONE 50 MG TABS: 50 | 30 days supply | Qty: 60 | Fill #3

## 2019-04-21 MED FILL — SILVADENE 1% CREAM: 1 | 30 days supply | Qty: 400 | Fill #1

## 2019-05-02 MED FILL — metFORMIN HCL 500 MG TABS: 500 | 90 days supply | Qty: 90 | Fill #0

## 2019-05-16 MED FILL — ATORVASTATIN 20 MG TABLET: 20 | 90 days supply | Qty: 90 | Fill #1

## 2019-05-26 DIAGNOSIS — A499 Bacterial infection, unspecified: Secondary | ICD-10-CM | POA: Diagnosis not present

## 2019-05-26 MED FILL — CLINDAMYCIN HCL 300 MG CAPS: 300 | 30 days supply | Qty: 60 | Fill #0

## 2019-05-26 MED FILL — SILVADENE 1% CREAM: 1 | 30 days supply | Qty: 400 | Fill #0

## 2019-05-30 MED FILL — SULFAMETHOXAZOLE-TMP DS TAB: 800-160 | 14 days supply | Qty: 28 | Fill #0

## 2019-07-11 MED FILL — MINOCYCLINE HCL 100 MG TABS: 100 | 30 days supply | Qty: 60 | Fill #0

## 2019-07-20 MED FILL — FLUCONAZOLE 100 MG TAB: 100 | 21 days supply | Qty: 6 | Fill #0

## 2019-07-20 MED FILL — MOXIFLOXACIN HCL 400 MG TAB: 400 | 14 days supply | Qty: 14 | Fill #0

## 2019-07-20 MED FILL — rifAMPin 300 MG CAPS: 300 | 14 days supply | Qty: 28 | Fill #0

## 2019-07-20 MED FILL — traMADol HCL 50 MG TABS: 50 | 5 days supply | Qty: 10 | Fill #0

## 2019-07-20 MED FILL — METRONIDAZOLE 500 MG TABS: 500 | 14 days supply | Qty: 42 | Fill #0

## 2019-08-08 MED FILL — METFORMIN HCL 500 MG TABS: 500 | 90 days supply | Qty: 90 | Fill #0

## 2019-08-16 DIAGNOSIS — E785 Hyperlipidemia, unspecified: Secondary | ICD-10-CM | POA: Diagnosis not present

## 2019-08-16 DIAGNOSIS — Z7984 Long term (current) use of oral hypoglycemic drugs: Secondary | ICD-10-CM | POA: Diagnosis not present

## 2019-08-16 DIAGNOSIS — E1169 Type 2 diabetes mellitus with other specified complication: Secondary | ICD-10-CM | POA: Diagnosis not present

## 2019-08-16 DIAGNOSIS — Z6841 Body Mass Index (BMI) 40.0 and over, adult: Secondary | ICD-10-CM | POA: Diagnosis not present

## 2019-08-17 MED FILL — ATORVASTATIN 20 MG TABLET: 20 | 90 days supply | Qty: 90 | Fill #0

## 2019-09-05 MED FILL — FLUCONAZOLE 100 MG TAB: 100 | 21 days supply | Qty: 6 | Fill #1

## 2019-10-11 MED FILL — GABAPENTIN 100 MG CAPSULE: 100 | 30 days supply | Qty: 90 | Fill #0

## 2019-10-11 MED FILL — SULFAMETHOXAZOLE-TMP DS TAB: 800-160 | 14 days supply | Qty: 28 | Fill #0

## 2019-11-01 DIAGNOSIS — L732 Hidradenitis suppurativa: Secondary | ICD-10-CM | POA: Diagnosis not present

## 2019-11-01 MED FILL — METFORMIN HCL 500 MG TABS: 500 | 90 days supply | Qty: 90 | Fill #1

## 2019-11-08 MED FILL — ATORVASTATIN 20 MG TABLET: 20 | 90 days supply | Qty: 90 | Fill #1

## 2019-11-09 MED FILL — SULFAMETHOXAZOLE-TMP DS TAB: 800-160 | 14 days supply | Qty: 28 | Fill #0

## 2019-12-07 MED FILL — SILVADENE 1% CREAM: 1 | 30 days supply | Qty: 400 | Fill #0

## 2020-01-30 ENCOUNTER — Other Ambulatory Visit (HOSPITAL_COMMUNITY): Payer: Self-pay | Admitting: *Deleted

## 2020-01-30 MED FILL — SILVADENE 1% CREAM: 1 | 30 days supply | Qty: 400 | Fill #0

## 2020-01-30 MED FILL — SULFAMETHOXAZOLE-TMP DS TAB: 800-160 | 14 days supply | Qty: 28 | Fill #0

## 2020-01-30 MED FILL — GABAPENTIN 100 MG CAPSULE: 100 | 30 days supply | Qty: 90 | Fill #0

## 2020-02-10 ENCOUNTER — Other Ambulatory Visit (HOSPITAL_COMMUNITY): Payer: Self-pay | Admitting: Family Medicine

## 2020-02-10 DIAGNOSIS — E1169 Type 2 diabetes mellitus with other specified complication: Secondary | ICD-10-CM | POA: Diagnosis not present

## 2020-02-10 DIAGNOSIS — Z0000 Encounter for general adult medical examination without abnormal findings: Secondary | ICD-10-CM | POA: Diagnosis not present

## 2020-02-10 DIAGNOSIS — E559 Vitamin D deficiency, unspecified: Secondary | ICD-10-CM | POA: Diagnosis not present

## 2020-02-10 DIAGNOSIS — L732 Hidradenitis suppurativa: Secondary | ICD-10-CM | POA: Diagnosis not present

## 2020-02-10 DIAGNOSIS — E785 Hyperlipidemia, unspecified: Secondary | ICD-10-CM | POA: Diagnosis not present

## 2020-02-10 DIAGNOSIS — Z79899 Other long term (current) drug therapy: Secondary | ICD-10-CM | POA: Diagnosis not present

## 2020-02-10 DIAGNOSIS — Z6841 Body Mass Index (BMI) 40.0 and over, adult: Secondary | ICD-10-CM | POA: Diagnosis not present

## 2020-02-10 DIAGNOSIS — Z800 Family history of malignant neoplasm of digestive organs: Secondary | ICD-10-CM | POA: Diagnosis not present

## 2020-02-10 MED FILL — ATORVASTATIN CALCIUM 20 MG: 20 | 90 days supply | Qty: 90 | Fill #0

## 2020-02-10 MED FILL — METFORMIN HCL 500 MG TABS: 500 | 90 days supply | Qty: 90 | Fill #0

## 2020-02-13 DIAGNOSIS — E785 Hyperlipidemia, unspecified: Secondary | ICD-10-CM | POA: Diagnosis not present

## 2020-02-13 DIAGNOSIS — Z800 Family history of malignant neoplasm of digestive organs: Secondary | ICD-10-CM | POA: Diagnosis not present

## 2020-02-13 DIAGNOSIS — E559 Vitamin D deficiency, unspecified: Secondary | ICD-10-CM | POA: Diagnosis not present

## 2020-02-13 DIAGNOSIS — Z79899 Other long term (current) drug therapy: Secondary | ICD-10-CM | POA: Diagnosis not present

## 2020-02-13 DIAGNOSIS — Z6841 Body Mass Index (BMI) 40.0 and over, adult: Secondary | ICD-10-CM | POA: Diagnosis not present

## 2020-02-13 DIAGNOSIS — E1169 Type 2 diabetes mellitus with other specified complication: Secondary | ICD-10-CM | POA: Diagnosis not present

## 2020-02-13 DIAGNOSIS — L732 Hidradenitis suppurativa: Secondary | ICD-10-CM | POA: Diagnosis not present

## 2020-02-13 DIAGNOSIS — Z0000 Encounter for general adult medical examination without abnormal findings: Secondary | ICD-10-CM | POA: Diagnosis not present

## 2020-02-16 ENCOUNTER — Other Ambulatory Visit (HOSPITAL_COMMUNITY): Payer: Self-pay | Admitting: Family Medicine

## 2020-02-16 MED FILL — VIT D2 1.25 MG (50,000 UNIT: 1.25 MG | 84 days supply | Qty: 24 | Fill #0

## 2020-03-19 DIAGNOSIS — Z1231 Encounter for screening mammogram for malignant neoplasm of breast: Secondary | ICD-10-CM | POA: Diagnosis not present

## 2020-05-10 MED FILL — ATORVASTATIN CALCIUM 20 MG: 20 | 90 days supply | Qty: 90 | Fill #1

## 2020-05-10 MED FILL — METFORMIN HCL 500 MG TABS: 500 | 90 days supply | Qty: 90 | Fill #1

## 2020-05-21 ENCOUNTER — Other Ambulatory Visit (HOSPITAL_COMMUNITY): Payer: Self-pay | Admitting: *Deleted

## 2020-05-21 MED FILL — SILVADENE 1% CREAM: 1 | 30 days supply | Qty: 400 | Fill #0

## 2020-05-21 MED FILL — rifAMPin 300 MG CAPS: 300 | 30 days supply | Qty: 60 | Fill #0

## 2020-05-21 MED FILL — SPIRONOLACTONE 50 MG TABLET: 50 | 30 days supply | Qty: 60 | Fill #0

## 2020-05-21 MED FILL — GABAPENTIN 100 MG CAPSULE: 100 | 30 days supply | Qty: 120 | Fill #0

## 2020-05-21 MED FILL — MOXIFLOXACIN HCL 400 MG TAB: 400 | 30 days supply | Qty: 30 | Fill #0

## 2020-05-21 MED FILL — FLUCONAZOLE 100 MG TAB: 100 | 24 days supply | Qty: 6 | Fill #0

## 2020-05-21 MED FILL — METRONIDAZOLE 500 MG TABS: 500 | 30 days supply | Qty: 90 | Fill #0

## 2020-05-22 DIAGNOSIS — Z5181 Encounter for therapeutic drug level monitoring: Secondary | ICD-10-CM | POA: Diagnosis not present

## 2020-05-25 ENCOUNTER — Ambulatory Visit: Payer: 59 | Attending: Internal Medicine

## 2020-05-25 DIAGNOSIS — Z23 Encounter for immunization: Secondary | ICD-10-CM

## 2020-05-25 NOTE — Progress Notes (Signed)
   Covid-19 Vaccination Clinic  Name:  Priscilla Webb    MRN: [id number] DOB: 07-Sep-1965  05/25/2020  [name] was observed post Covid-19 immunization for 15 minutes without incident. She was provided with Vaccine Information Sheet and instruction to access the V-Safe system.   [name] was instructed to call 911 with any severe reactions post vaccine: Marland Kitchen Difficulty breathing  . Swelling of face and throat  . A fast heartbeat  . A bad rash all over body  . Dizziness and weakness   Immunizations Administered    Name Date Dose VIS Date Route   PFIZER Comrnaty(Gray TOP) Covid-19 Vaccine 05/25/2020 11:00 AM 0.3 mL 03/29/2020 Intramuscular   Manufacturer: [place]   Lot: [id number]   NDC: [phone]

## 2020-06-21 MED FILL — SPIRONOLACTONE 50 MG TABLET: 50 | 30 days supply | Qty: 60 | Fill #1

## 2020-07-26 ENCOUNTER — Other Ambulatory Visit (HOSPITAL_COMMUNITY): Payer: Self-pay

## 2020-07-26 MED FILL — Spironolactone Tab 50 MG: ORAL | 30 days supply | Qty: 60 | Fill #0 | Status: AC

## 2020-08-15 ENCOUNTER — Other Ambulatory Visit (HOSPITAL_COMMUNITY): Payer: Self-pay

## 2020-08-15 DIAGNOSIS — E1169 Type 2 diabetes mellitus with other specified complication: Secondary | ICD-10-CM | POA: Diagnosis not present

## 2020-08-15 DIAGNOSIS — Z79899 Other long term (current) drug therapy: Secondary | ICD-10-CM | POA: Diagnosis not present

## 2020-08-15 DIAGNOSIS — E559 Vitamin D deficiency, unspecified: Secondary | ICD-10-CM | POA: Diagnosis not present

## 2020-08-15 DIAGNOSIS — Z6841 Body Mass Index (BMI) 40.0 and over, adult: Secondary | ICD-10-CM | POA: Diagnosis not present

## 2020-08-15 DIAGNOSIS — L304 Erythema intertrigo: Secondary | ICD-10-CM | POA: Diagnosis not present

## 2020-08-15 DIAGNOSIS — L732 Hidradenitis suppurativa: Secondary | ICD-10-CM | POA: Diagnosis not present

## 2020-08-15 DIAGNOSIS — E7800 Pure hypercholesterolemia, unspecified: Secondary | ICD-10-CM | POA: Diagnosis not present

## 2020-08-15 MED ORDER — DAPSONE 25 MG PO TABS
ORAL_TABLET | ORAL | 3 refills | Status: AC
  Filled 2020-08-15: qty 180, 90d supply, fill #0

## 2020-08-15 MED ORDER — SPIRONOLACTONE 50 MG PO TABS
50.0000 mg | ORAL_TABLET | Freq: Two times a day (BID) | ORAL | 5 refills | Status: AC
  Filled 2020-08-15: qty 60, 30d supply, fill #0
  Filled 2020-10-01: qty 60, 30d supply, fill #1
  Filled 2020-10-23 – 2020-11-07 (×2): qty 60, 30d supply, fill #2
  Filled 2020-12-20: qty 60, 30d supply, fill #3
  Filled 2021-01-10: qty 60, 30d supply, fill #4

## 2020-08-16 ENCOUNTER — Other Ambulatory Visit (HOSPITAL_COMMUNITY): Payer: Self-pay

## 2020-08-16 MED FILL — Fluconazole Tab 100 MG: ORAL | 28 days supply | Qty: 6 | Fill #0 | Status: AC

## 2020-08-16 MED FILL — Atorvastatin Calcium Tab 20 MG (Base Equivalent): ORAL | 90 days supply | Qty: 90 | Fill #0 | Status: AC

## 2020-08-16 MED FILL — Metformin HCl Tab 500 MG: ORAL | 90 days supply | Qty: 90 | Fill #0 | Status: AC

## 2020-08-20 ENCOUNTER — Other Ambulatory Visit (HOSPITAL_COMMUNITY): Payer: Self-pay

## 2020-08-20 MED ORDER — VITAMIN D (ERGOCALCIFEROL) 1.25 MG (50000 UNIT) PO CAPS
ORAL_CAPSULE | ORAL | 3 refills | Status: AC
  Filled 2020-08-20: qty 12, 84d supply, fill #0
  Filled 2020-11-05: qty 12, 84d supply, fill #1

## 2020-09-26 ENCOUNTER — Other Ambulatory Visit (HOSPITAL_COMMUNITY): Payer: Self-pay

## 2020-09-26 MED ORDER — CLINDAMYCIN HCL 150 MG PO CAPS
150.0000 mg | ORAL_CAPSULE | Freq: Three times a day (TID) | ORAL | 0 refills | Status: AC
  Filled 2020-09-26: qty 21, 7d supply, fill #0

## 2020-10-01 ENCOUNTER — Other Ambulatory Visit (HOSPITAL_COMMUNITY): Payer: Self-pay

## 2020-10-03 ENCOUNTER — Other Ambulatory Visit (HOSPITAL_COMMUNITY): Payer: Self-pay

## 2020-10-23 ENCOUNTER — Other Ambulatory Visit (HOSPITAL_COMMUNITY): Payer: Self-pay

## 2020-11-05 ENCOUNTER — Other Ambulatory Visit (HOSPITAL_COMMUNITY): Payer: Self-pay

## 2020-11-05 MED FILL — Atorvastatin Calcium Tab 20 MG (Base Equivalent): ORAL | 90 days supply | Qty: 90 | Fill #1 | Status: AC

## 2020-11-07 ENCOUNTER — Other Ambulatory Visit (HOSPITAL_COMMUNITY): Payer: Self-pay

## 2020-11-29 ENCOUNTER — Other Ambulatory Visit (HOSPITAL_COMMUNITY): Payer: Self-pay

## 2020-11-29 DIAGNOSIS — Z79899 Other long term (current) drug therapy: Secondary | ICD-10-CM | POA: Diagnosis not present

## 2020-11-29 DIAGNOSIS — Z5181 Encounter for therapeutic drug level monitoring: Secondary | ICD-10-CM | POA: Diagnosis not present

## 2020-11-29 DIAGNOSIS — L732 Hidradenitis suppurativa: Secondary | ICD-10-CM | POA: Diagnosis not present

## 2020-11-29 DIAGNOSIS — L304 Erythema intertrigo: Secondary | ICD-10-CM | POA: Diagnosis not present

## 2020-11-29 MED ORDER — RIFAMPIN 300 MG PO CAPS
ORAL_CAPSULE | ORAL | 1 refills | Status: AC
  Filled 2020-11-29: qty 60, 30d supply, fill #0
  Filled 2021-11-12: qty 60, 30d supply, fill #1

## 2020-11-29 MED ORDER — MOXIFLOXACIN HCL 400 MG PO TABS
ORAL_TABLET | ORAL | 1 refills | Status: AC
  Filled 2020-11-29: qty 30, 30d supply, fill #0
  Filled 2021-11-12: qty 30, 30d supply, fill #1

## 2020-11-29 MED ORDER — DAPSONE 25 MG PO TABS
ORAL_TABLET | ORAL | 1 refills | Status: DC
  Filled 2020-11-29: qty 270, 90d supply, fill #0
  Filled 2021-07-29: qty 270, 90d supply, fill #1

## 2020-11-30 ENCOUNTER — Other Ambulatory Visit (HOSPITAL_COMMUNITY): Payer: Self-pay

## 2020-11-30 MED FILL — Metformin HCl Tab 500 MG: ORAL | 90 days supply | Qty: 90 | Fill #1 | Status: AC

## 2020-12-20 ENCOUNTER — Other Ambulatory Visit (HOSPITAL_COMMUNITY): Payer: Self-pay

## 2021-01-10 ENCOUNTER — Other Ambulatory Visit (HOSPITAL_COMMUNITY): Payer: Self-pay

## 2021-01-14 ENCOUNTER — Other Ambulatory Visit (HOSPITAL_COMMUNITY): Payer: Self-pay

## 2021-02-05 ENCOUNTER — Other Ambulatory Visit (HOSPITAL_COMMUNITY): Payer: Self-pay

## 2021-02-05 MED ORDER — ATORVASTATIN CALCIUM 20 MG PO TABS
ORAL_TABLET | ORAL | 0 refills | Status: DC
  Filled 2021-02-05: qty 90, 90d supply, fill #0

## 2021-02-07 ENCOUNTER — Other Ambulatory Visit (HOSPITAL_COMMUNITY): Payer: Self-pay

## 2021-02-12 ENCOUNTER — Other Ambulatory Visit (HOSPITAL_COMMUNITY): Payer: Self-pay

## 2021-02-12 MED ORDER — INFLUENZA VAC SPLIT QUAD 0.5 ML IM SUSY
PREFILLED_SYRINGE | INTRAMUSCULAR | 0 refills | Status: AC
  Filled 2021-02-12: qty 0.5, 1d supply, fill #0

## 2021-02-26 ENCOUNTER — Other Ambulatory Visit (HOSPITAL_COMMUNITY): Payer: Self-pay

## 2021-02-26 DIAGNOSIS — I10 Essential (primary) hypertension: Secondary | ICD-10-CM | POA: Diagnosis not present

## 2021-02-26 DIAGNOSIS — M5441 Lumbago with sciatica, right side: Secondary | ICD-10-CM | POA: Diagnosis not present

## 2021-02-26 DIAGNOSIS — E7800 Pure hypercholesterolemia, unspecified: Secondary | ICD-10-CM | POA: Diagnosis not present

## 2021-02-26 DIAGNOSIS — E1169 Type 2 diabetes mellitus with other specified complication: Secondary | ICD-10-CM | POA: Diagnosis not present

## 2021-02-26 DIAGNOSIS — Z7984 Long term (current) use of oral hypoglycemic drugs: Secondary | ICD-10-CM | POA: Diagnosis not present

## 2021-02-26 MED ORDER — PREDNISONE 10 MG (21) PO TBPK
ORAL_TABLET | ORAL | 0 refills | Status: AC
  Filled 2021-02-26: qty 21, 6d supply, fill #0

## 2021-02-26 MED ORDER — METHOCARBAMOL 500 MG PO TABS
ORAL_TABLET | ORAL | 0 refills | Status: AC
  Filled 2021-02-26: qty 30, 30d supply, fill #0

## 2021-02-26 MED ORDER — METFORMIN HCL 500 MG PO TABS
ORAL_TABLET | ORAL | 2 refills | Status: DC
  Filled 2021-02-26: qty 90, 90d supply, fill #0
  Filled 2021-06-04: qty 90, 90d supply, fill #1
  Filled 2021-08-29: qty 90, 90d supply, fill #2

## 2021-03-04 DIAGNOSIS — I10 Essential (primary) hypertension: Secondary | ICD-10-CM | POA: Diagnosis not present

## 2021-03-04 DIAGNOSIS — Z0000 Encounter for general adult medical examination without abnormal findings: Secondary | ICD-10-CM | POA: Diagnosis not present

## 2021-03-04 DIAGNOSIS — Z800 Family history of malignant neoplasm of digestive organs: Secondary | ICD-10-CM | POA: Diagnosis not present

## 2021-03-04 DIAGNOSIS — E1169 Type 2 diabetes mellitus with other specified complication: Secondary | ICD-10-CM | POA: Diagnosis not present

## 2021-03-04 DIAGNOSIS — L732 Hidradenitis suppurativa: Secondary | ICD-10-CM | POA: Diagnosis not present

## 2021-03-04 DIAGNOSIS — E559 Vitamin D deficiency, unspecified: Secondary | ICD-10-CM | POA: Diagnosis not present

## 2021-03-04 DIAGNOSIS — Z7984 Long term (current) use of oral hypoglycemic drugs: Secondary | ICD-10-CM | POA: Diagnosis not present

## 2021-03-04 DIAGNOSIS — K760 Fatty (change of) liver, not elsewhere classified: Secondary | ICD-10-CM | POA: Diagnosis not present

## 2021-03-06 ENCOUNTER — Other Ambulatory Visit (HOSPITAL_COMMUNITY): Payer: Self-pay

## 2021-03-06 MED ORDER — ERGOCALCIFEROL 1.25 MG (50000 UT) PO CAPS
ORAL_CAPSULE | ORAL | 0 refills | Status: AC
  Filled 2021-03-06: qty 8, 56d supply, fill #0

## 2021-03-21 ENCOUNTER — Other Ambulatory Visit (HOSPITAL_COMMUNITY): Payer: Self-pay

## 2021-03-21 DIAGNOSIS — Z5181 Encounter for therapeutic drug level monitoring: Secondary | ICD-10-CM | POA: Diagnosis not present

## 2021-03-21 DIAGNOSIS — L732 Hidradenitis suppurativa: Secondary | ICD-10-CM | POA: Diagnosis not present

## 2021-03-21 MED ORDER — SPIRONOLACTONE 50 MG PO TABS
50.0000 mg | ORAL_TABLET | Freq: Two times a day (BID) | ORAL | 5 refills | Status: AC
  Filled 2021-03-21: qty 60, 30d supply, fill #0
  Filled 2021-06-04: qty 60, 30d supply, fill #1
  Filled 2021-07-29: qty 60, 30d supply, fill #2
  Filled 2021-09-10: qty 60, 30d supply, fill #3
  Filled 2021-10-31: qty 60, 30d supply, fill #4
  Filled 2021-12-26: qty 60, 30d supply, fill #5

## 2021-03-21 MED ORDER — SILVER SULFADIAZINE 1 % EX CREA
TOPICAL_CREAM | CUTANEOUS | 3 refills | Status: AC
  Filled 2021-03-21: qty 220, 30d supply, fill #0

## 2021-03-25 DIAGNOSIS — Z1231 Encounter for screening mammogram for malignant neoplasm of breast: Secondary | ICD-10-CM | POA: Diagnosis not present

## 2021-05-21 ENCOUNTER — Other Ambulatory Visit (HOSPITAL_COMMUNITY): Payer: Self-pay

## 2021-05-22 ENCOUNTER — Other Ambulatory Visit (HOSPITAL_COMMUNITY): Payer: Self-pay

## 2021-05-22 MED ORDER — ATORVASTATIN CALCIUM 20 MG PO TABS
20.0000 mg | ORAL_TABLET | Freq: Every day | ORAL | 0 refills | Status: DC
  Filled 2021-05-22: qty 90, 90d supply, fill #0

## 2021-06-04 ENCOUNTER — Other Ambulatory Visit (HOSPITAL_COMMUNITY): Payer: Self-pay

## 2021-06-26 ENCOUNTER — Other Ambulatory Visit (HOSPITAL_COMMUNITY): Payer: Self-pay

## 2021-06-26 MED ORDER — CLINDAMYCIN HCL 300 MG PO CAPS
ORAL_CAPSULE | ORAL | 2 refills | Status: AC
  Filled 2021-06-26: qty 60, 30d supply, fill #0

## 2021-06-28 DIAGNOSIS — Z5181 Encounter for therapeutic drug level monitoring: Secondary | ICD-10-CM | POA: Diagnosis not present

## 2021-06-28 DIAGNOSIS — L732 Hidradenitis suppurativa: Secondary | ICD-10-CM | POA: Diagnosis not present

## 2021-07-29 ENCOUNTER — Other Ambulatory Visit (HOSPITAL_COMMUNITY): Payer: Self-pay

## 2021-07-30 ENCOUNTER — Other Ambulatory Visit (HOSPITAL_COMMUNITY): Payer: Self-pay

## 2021-08-12 DIAGNOSIS — L732 Hidradenitis suppurativa: Secondary | ICD-10-CM | POA: Diagnosis not present

## 2021-08-20 ENCOUNTER — Other Ambulatory Visit (HOSPITAL_COMMUNITY): Payer: Self-pay

## 2021-08-20 MED ORDER — ATORVASTATIN CALCIUM 20 MG PO TABS
20.0000 mg | ORAL_TABLET | Freq: Every day | ORAL | 0 refills | Status: DC
  Filled 2021-08-20: qty 90, 90d supply, fill #0

## 2021-08-21 ENCOUNTER — Other Ambulatory Visit (HOSPITAL_COMMUNITY): Payer: Self-pay

## 2021-08-28 DIAGNOSIS — L732 Hidradenitis suppurativa: Secondary | ICD-10-CM | POA: Diagnosis not present

## 2021-08-29 ENCOUNTER — Other Ambulatory Visit (HOSPITAL_COMMUNITY): Payer: Self-pay

## 2021-09-03 DIAGNOSIS — Z6841 Body Mass Index (BMI) 40.0 and over, adult: Secondary | ICD-10-CM | POA: Diagnosis not present

## 2021-09-03 DIAGNOSIS — E1169 Type 2 diabetes mellitus with other specified complication: Secondary | ICD-10-CM | POA: Diagnosis not present

## 2021-09-03 DIAGNOSIS — E559 Vitamin D deficiency, unspecified: Secondary | ICD-10-CM | POA: Diagnosis not present

## 2021-09-03 DIAGNOSIS — E7800 Pure hypercholesterolemia, unspecified: Secondary | ICD-10-CM | POA: Diagnosis not present

## 2021-09-06 ENCOUNTER — Other Ambulatory Visit (HOSPITAL_COMMUNITY): Payer: Self-pay

## 2021-09-06 MED ORDER — ERGOCALCIFEROL 1.25 MG (50000 UT) PO CAPS
ORAL_CAPSULE | ORAL | 1 refills | Status: AC
  Filled 2021-09-06: qty 24, 84d supply, fill #0

## 2021-09-10 ENCOUNTER — Other Ambulatory Visit (HOSPITAL_COMMUNITY): Payer: Self-pay

## 2021-09-19 ENCOUNTER — Other Ambulatory Visit (HOSPITAL_COMMUNITY): Payer: Self-pay

## 2021-09-19 DIAGNOSIS — L732 Hidradenitis suppurativa: Secondary | ICD-10-CM | POA: Diagnosis not present

## 2021-09-19 DIAGNOSIS — Z5181 Encounter for therapeutic drug level monitoring: Secondary | ICD-10-CM | POA: Diagnosis not present

## 2021-09-19 MED ORDER — SPIRONOLACTONE 50 MG PO TABS
50.0000 mg | ORAL_TABLET | Freq: Two times a day (BID) | ORAL | 5 refills | Status: AC
  Filled 2021-09-19 – 2022-03-22 (×2): qty 60, 30d supply, fill #0

## 2021-09-19 MED ORDER — DAPSONE 25 MG PO TABS
75.0000 mg | ORAL_TABLET | Freq: Every day | ORAL | 1 refills | Status: AC
  Filled 2021-09-19: qty 270, 90d supply, fill #0
  Filled 2022-03-21: qty 180, 60d supply, fill #0

## 2021-09-23 DIAGNOSIS — L732 Hidradenitis suppurativa: Secondary | ICD-10-CM | POA: Diagnosis not present

## 2021-10-31 ENCOUNTER — Other Ambulatory Visit (HOSPITAL_COMMUNITY): Payer: Self-pay

## 2021-11-12 ENCOUNTER — Other Ambulatory Visit (HOSPITAL_COMMUNITY): Payer: Self-pay

## 2021-11-13 ENCOUNTER — Other Ambulatory Visit (HOSPITAL_COMMUNITY): Payer: Self-pay

## 2021-11-18 DIAGNOSIS — Z5181 Encounter for therapeutic drug level monitoring: Secondary | ICD-10-CM | POA: Diagnosis not present

## 2021-11-18 DIAGNOSIS — L732 Hidradenitis suppurativa: Secondary | ICD-10-CM | POA: Diagnosis not present

## 2021-11-21 ENCOUNTER — Other Ambulatory Visit (HOSPITAL_COMMUNITY): Payer: Self-pay

## 2021-11-21 MED ORDER — ATORVASTATIN CALCIUM 20 MG PO TABS
20.0000 mg | ORAL_TABLET | Freq: Every day | ORAL | 1 refills | Status: DC
  Filled 2021-11-21: qty 90, 90d supply, fill #0
  Filled 2022-03-21: qty 90, 90d supply, fill #1

## 2021-11-21 MED ORDER — METFORMIN HCL 500 MG PO TABS
500.0000 mg | ORAL_TABLET | Freq: Every day | ORAL | 1 refills | Status: AC
  Filled 2021-11-21: qty 90, 90d supply, fill #0

## 2021-12-09 ENCOUNTER — Other Ambulatory Visit (HOSPITAL_COMMUNITY): Payer: Self-pay

## 2021-12-09 DIAGNOSIS — U071 COVID-19: Secondary | ICD-10-CM | POA: Diagnosis not present

## 2021-12-09 MED ORDER — PAXLOVID (300/100) 20 X 150 MG & 10 X 100MG PO TBPK
ORAL_TABLET | ORAL | 0 refills | Status: AC
  Filled 2021-12-09: qty 30, 5d supply, fill #0

## 2021-12-09 MED ORDER — PSEUDOEPH-BROMPHEN-DM 30-2-10 MG/5ML PO SYRP
ORAL_SOLUTION | ORAL | 0 refills | Status: AC
  Filled 2021-12-09: qty 180, 5d supply, fill #0

## 2021-12-26 ENCOUNTER — Other Ambulatory Visit (HOSPITAL_COMMUNITY): Payer: Self-pay

## 2022-01-15 DIAGNOSIS — L732 Hidradenitis suppurativa: Secondary | ICD-10-CM | POA: Diagnosis not present

## 2022-03-12 DIAGNOSIS — L732 Hidradenitis suppurativa: Secondary | ICD-10-CM | POA: Diagnosis not present

## 2022-03-17 ENCOUNTER — Other Ambulatory Visit (HOSPITAL_COMMUNITY): Payer: Self-pay

## 2022-03-17 DIAGNOSIS — Z0000 Encounter for general adult medical examination without abnormal findings: Secondary | ICD-10-CM | POA: Diagnosis not present

## 2022-03-17 DIAGNOSIS — E1169 Type 2 diabetes mellitus with other specified complication: Secondary | ICD-10-CM | POA: Diagnosis not present

## 2022-03-17 DIAGNOSIS — L732 Hidradenitis suppurativa: Secondary | ICD-10-CM | POA: Diagnosis not present

## 2022-03-17 DIAGNOSIS — Z6841 Body Mass Index (BMI) 40.0 and over, adult: Secondary | ICD-10-CM | POA: Diagnosis not present

## 2022-03-17 DIAGNOSIS — E7800 Pure hypercholesterolemia, unspecified: Secondary | ICD-10-CM | POA: Diagnosis not present

## 2022-03-17 DIAGNOSIS — M545 Low back pain, unspecified: Secondary | ICD-10-CM | POA: Diagnosis not present

## 2022-03-17 DIAGNOSIS — Z23 Encounter for immunization: Secondary | ICD-10-CM | POA: Diagnosis not present

## 2022-03-17 DIAGNOSIS — E559 Vitamin D deficiency, unspecified: Secondary | ICD-10-CM | POA: Diagnosis not present

## 2022-03-17 MED ORDER — OZEMPIC (0.25 OR 0.5 MG/DOSE) 2 MG/3ML ~~LOC~~ SOPN
PEN_INJECTOR | SUBCUTANEOUS | 0 refills | Status: DC
  Filled 2022-03-17: qty 3, 30d supply, fill #0

## 2022-03-17 MED ORDER — METFORMIN HCL 500 MG PO TABS
500.0000 mg | ORAL_TABLET | Freq: Every day | ORAL | 3 refills | Status: AC
  Filled 2022-03-17: qty 90, 90d supply, fill #0

## 2022-03-21 ENCOUNTER — Other Ambulatory Visit (HOSPITAL_COMMUNITY): Payer: Self-pay

## 2022-03-22 ENCOUNTER — Other Ambulatory Visit (HOSPITAL_COMMUNITY): Payer: Self-pay

## 2022-03-24 ENCOUNTER — Other Ambulatory Visit (HOSPITAL_COMMUNITY): Payer: Self-pay

## 2022-03-24 MED ORDER — VITAMIN D (ERGOCALCIFEROL) 1.25 MG (50000 UNIT) PO CAPS
50000.0000 [IU] | ORAL_CAPSULE | ORAL | 0 refills | Status: AC
  Filled 2022-03-24: qty 24, 84d supply, fill #0

## 2022-03-25 ENCOUNTER — Other Ambulatory Visit (HOSPITAL_COMMUNITY): Payer: Self-pay

## 2022-03-27 DIAGNOSIS — M545 Low back pain, unspecified: Secondary | ICD-10-CM | POA: Diagnosis not present

## 2022-03-31 DIAGNOSIS — Z1231 Encounter for screening mammogram for malignant neoplasm of breast: Secondary | ICD-10-CM | POA: Diagnosis not present

## 2022-04-01 ENCOUNTER — Other Ambulatory Visit (HOSPITAL_COMMUNITY): Payer: Self-pay

## 2022-04-01 DIAGNOSIS — L732 Hidradenitis suppurativa: Secondary | ICD-10-CM | POA: Diagnosis not present

## 2022-04-01 DIAGNOSIS — Z5181 Encounter for therapeutic drug level monitoring: Secondary | ICD-10-CM | POA: Diagnosis not present

## 2022-04-01 MED ORDER — SPIRONOLACTONE 50 MG PO TABS
50.0000 mg | ORAL_TABLET | Freq: Two times a day (BID) | ORAL | 5 refills | Status: AC
  Filled 2022-04-01 – 2023-03-24 (×2): qty 60, 30d supply, fill #0

## 2022-04-01 MED ORDER — SILVER SULFADIAZINE 1 % EX CREA
TOPICAL_CREAM | CUTANEOUS | 5 refills | Status: AC
  Filled 2022-04-01: qty 200, 30d supply, fill #0
  Filled 2022-09-08 (×2): qty 200, 30d supply, fill #1

## 2022-04-02 ENCOUNTER — Other Ambulatory Visit (HOSPITAL_COMMUNITY): Payer: Self-pay

## 2022-04-03 ENCOUNTER — Other Ambulatory Visit (HOSPITAL_COMMUNITY): Payer: Self-pay

## 2022-04-05 ENCOUNTER — Other Ambulatory Visit (HOSPITAL_COMMUNITY): Payer: Self-pay

## 2022-04-07 ENCOUNTER — Ambulatory Visit: Payer: 59 | Attending: Sports Medicine

## 2022-04-07 ENCOUNTER — Other Ambulatory Visit: Payer: Self-pay

## 2022-04-07 DIAGNOSIS — M6281 Muscle weakness (generalized): Secondary | ICD-10-CM | POA: Insufficient documentation

## 2022-04-07 DIAGNOSIS — M5459 Other low back pain: Secondary | ICD-10-CM | POA: Diagnosis not present

## 2022-04-07 DIAGNOSIS — R252 Cramp and spasm: Secondary | ICD-10-CM | POA: Diagnosis not present

## 2022-04-07 NOTE — Therapy (Signed)
OUTPATIENT PHYSICAL THERAPY THORACOLUMBAR EVALUATION   Patient Name: Priscilla Webb MRN: [id number] DOB:1965-09-18, 56 y.o., female Today's Date: 04/07/2022  END OF SESSION:  PT End of Session - 04/07/22 1528     Visit Number 1    Date for PT Re-Evaluation 06/02/22    Authorization Type [employer]    PT Start Time 6644    PT Stop Time 1517    PT Time Calculation (min) 32 min    Activity Tolerance Patient tolerated treatment well    Behavior During Therapy WFL for tasks assessed/performed             Past Medical History:  Diagnosis Date   Asthma    Diabetes mellitus without complication ([place])    Past Surgical History:  Procedure Laterality Date   BIOPSY  04/07/2019   Procedure: BIOPSY;  Surgeon: Ronald Lobo, MD;  Location: [facility];  Service: Endoscopy;;   COLONOSCOPY     COLONOSCOPY WITH PROPOFOL N/A 04/07/2019   Procedure: COLONOSCOPY WITH PROPOFOL;  Surgeon: Ronald Lobo, MD;  Location: [facility];  Service: Endoscopy;  Laterality: N/A;   EAR CYST EXCISION Left 02/02/2014   Procedure: LEFT THIGH SEBACEOUS CYST EXCISION 2.8 X 2 CM;  Surgeon: Rolm Bookbinder, MD;  Location: [facility];  Service: General;  Laterality: Left;   HYSTEROSCOPY WITH D & C  06/11/1999   with exc. endometrial polyp   POLYPECTOMY  04/07/2019   Procedure: POLYPECTOMY;  Surgeon: Ronald Lobo, MD;  Location: [facility];  Service: Endoscopy;;   TONSILLECTOMY     TOTAL ABDOMINAL HYSTERECTOMY  10/08/2001   Patient Active Problem List   Diagnosis Date Noted   Type 2 diabetes mellitus ([place]) 09/08/2016   PCOS (polycystic ovarian syndrome) 09/08/2016    PCP: Kathyrn Lass, MD  REFERRING PROVIDER: Inez Catalina, MD  REFERRING DIAG: M54.50 (ICD-10-CM) - Low back pain, unspecified   Rationale for Evaluation and Treatment: Rehabilitation  THERAPY DIAG:  Other low back pain - Plan: PT plan of care cert/re-cert  Cramp and spasm - Plan: PT plan of care  cert/re-cert  Muscle weakness (generalized) - Plan: PT plan of care cert/re-cert  ONSET DATE: 1 year with flare-up in October 2023  SUBJECTIVE:                                                                                                                                                                                           SUBJECTIVE STATEMENT: Pt presents to PT with complaints of LBP that is chronic and worsened over the past few months after a trip where she did a lot of standing.  Standing and  walking is limited to 5 minutes due to LBP.  She was exercising at [facility] prior to October and is not able to do that now.  Can't do pool therapy due to skin condition.   PERTINENT HISTORY:  DM, obesity  PAIN:  Are you having pain? Yes: NPRS scale: 3/10 when sitting, up to 8/10 with standing > 5 minutes  Pain location: bil low back  Pain description: constant and intense  Aggravating factors: standing/walking >5 minutes, laundry  Relieving factors: sitting down, Ibuprofen  PRECAUTIONS: None  WEIGHT BEARING RESTRICTIONS: No  FALLS:  Has patient fallen in last 6 months? 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   PLOF: Independent with basic ADLs and Leisure: walking for exercise   PATIENT GOALS: return to to exercise, stand/walk without limitation  NEXT MD VISIT: 4 weeks   OBJECTIVE:   DIAGNOSTIC FINDINGS:  None  PATIENT SURVEYS:  FOTO 54 (goal is 59)  SCREENING FOR RED FLAGS: Bowel or bladder incontinence: No Spinal tumors: No Cauda equina syndrome: No Compression fracture: No Abdominal aneurysm: No  COGNITION: Overall cognitive status: Within functional limits for tasks assessed     SENSATION: WFL  MUSCLE LENGTH: Limited by 25% bil without pain  POSTURE: forward head, decreased lumbar lordosis, and flexed trunk   PALPATION: Reduced PA mobility  T10-L5, no pain.  Tension in bil lumbar paraspinals and bil gluteals   LUMBAR ROM:   Flexion and extension are full.  Sidebending reduced by 25% with pain at end range   LOWER EXTREMITY ROM:     WFLs without pain  LOWER EXTREMITY MMT:    Bil hips 4 to 4+/5, knees 4+/5 GAIT: Distance walked: 50 Assistive device utilized: None Level of assistance: Complete Independence Comments: slow mobility with reduced lumbar trunk rotation  TODAY'S TREATMENT:                                                                                                                     DATE: 04/07/2022    PATIENT EDUCATION:  Education details: Access Code: [email or web address] Person educated: Patient Education method: Explanation, Demonstration, and Handouts Education comprehension: verbalized understanding and returned demonstration  HOME EXERCISE PROGRAM: Access Code: [email or web address] URL: [email or web address] Date: 04/07/2022 Prepared by: Claiborne Billings  Exercises - Supine Lower Trunk Rotation  - 3 x daily - 7 x weekly - 1 sets - 3 reps - 20 hold - Hooklying Single Knee to Chest  - 3 x daily - 7 x weekly - 1 sets - 3 reps - 20 hold - Seated Hamstring Stretch  - 3 x daily - 7 x weekly - 1 sets - 3 reps - 20 hold - Seated Figure 4 Piriformis Stretch  - 3 x daily - 7 x weekly - 1 sets - 3 reps - 20 hold - Supine Figure 4 Piriformis Stretch  - 3 x daily - 7 x weekly - 1 sets - 3  reps - 20 hold  ASSESSMENT:  CLINICAL IMPRESSION: Patient is a 56 y.o. female who was seen today for physical therapy evaluation and treatment for LBP. Pt reports a 1 year history and flare-up of pain in October while she was traveling overseas.  She reports that she has had to stop exercising and is now limited to standing/walkin 5 minutes max due to 8/10 LBP with standing activity.  Pt with limited and painful lumbar A/ROM into sidebending and hamstring length is limited by 25% bilaterally.  Tension in bil gluteals and lumbar paraspinals  and reduced PA mobility in the lumbar spine.  Patient will benefit from skilled PT to address the below impairments and improve overall function.   OBJECTIVE IMPAIRMENTS: decreased activity tolerance, decreased endurance, difficulty walking, increased muscle spasms, obesity, and pain.   ACTIVITY LIMITATIONS: carrying, lifting, standing, squatting, and locomotion level  PARTICIPATION LIMITATIONS: meal prep, cleaning, laundry, and community activity  PERSONAL FACTORS: 1 comorbidity: obesity  are also affecting patient's functional outcome.   REHAB POTENTIAL: Good  CLINICAL DECISION MAKING: Stable/uncomplicated  EVALUATION COMPLEXITY: Low   GOALS: Goals reviewed with patient? Yes  SHORT TERM GOALS: Target date: 05/05/2022    Be independent in initial HEP Baseline: Goal status: INITIAL  2.  Report > or = to 30% reduction in LBP with standing and walking tasks  Baseline: 8/10 Goal status: INITIAL  3.  Reduce pain to stand and walk for > or = to 10-15 minutes without limitation due to LBP Baseline: 5 min Goal status: INITIAL  4.  Initiate return to gym exercises and verbalize modifications for pain Baseline:  Goal status: INITIAL   LONG TERM GOALS: Target date: 06/02/2022    Be independent in advanced HEP Baseline:  Goal status: INITIAL  2.  Improve FOTO to > or = to 65 Baseline: 54 Goal status: INITIAL  3.  Report > or = to 70% reduction in LBP with standing and walking tasks  Baseline: 8/10 Goal status: INITIAL  4.  Reduce pain to stand and walk for > or = to 20-25 minutes without limitation due to pain  Baseline:  Goal status: INITIAL  5.  Resume regular exercise routine to supplement HEP for strength and flexibility.  Baseline:  Goal status: INITIAL    PLAN:  PT FREQUENCY: 2x/week  PT DURATION: 8 weeks  PLANNED INTERVENTIONS: Therapeutic exercises, Therapeutic activity, Neuromuscular re-education, Balance training, Gait training, Patient/Family  education, Self Care, Joint mobilization, Aquatic Therapy, Dry Needling, Cryotherapy, Moist heat, Taping, Traction, Manual therapy, and Re-evaluation.  PLAN FOR NEXT SESSION: review HEP, Nu Step and show pt exercises she can safely do at the gym, DN to lumbar spine and gluteals if pt agrees (issue education handout), body mechanics education   Sigurd Sos, PT 04/07/22 3:29 PM

## 2022-04-22 ENCOUNTER — Encounter: Payer: Self-pay | Admitting: Physical Therapy

## 2022-04-22 ENCOUNTER — Ambulatory Visit: Payer: Commercial Managed Care - PPO | Attending: Sports Medicine | Admitting: Physical Therapy

## 2022-04-22 DIAGNOSIS — R252 Cramp and spasm: Secondary | ICD-10-CM | POA: Insufficient documentation

## 2022-04-22 DIAGNOSIS — M545 Low back pain, unspecified: Secondary | ICD-10-CM | POA: Diagnosis not present

## 2022-04-22 DIAGNOSIS — M6281 Muscle weakness (generalized): Secondary | ICD-10-CM | POA: Diagnosis not present

## 2022-04-22 DIAGNOSIS — M5459 Other low back pain: Secondary | ICD-10-CM | POA: Diagnosis not present

## 2022-04-22 NOTE — Therapy (Signed)
OUTPATIENT PHYSICAL THERAPY TREATMENT NOTE   Patient Name: Priscilla Webb MRN: [id number] DOB:01-Apr-1966, 57 y.o., female Today's Date: 04/22/2022  PCP: Kathyrn Lass, MD REFERRING PROVIDER:  Inez Catalina, MD  END OF SESSION:   PT End of Session - 04/22/22 0759     Visit Number 2    Date for PT Re-Evaluation 06/02/22    Authorization Type [employer]    PT Start Time 0800    PT Stop Time 0841    PT Time Calculation (min) 41 min    Activity Tolerance Patient tolerated treatment well    Behavior During Therapy [facility] for tasks assessed/performed             Past Medical History:  Diagnosis Date   Asthma    Diabetes mellitus without complication [facility])    Past Surgical History:  Procedure Laterality Date   BIOPSY  04/07/2019   Procedure: BIOPSY;  Surgeon: Ronald Lobo, MD;  Location: [facility];  Service: Endoscopy;;   COLONOSCOPY     COLONOSCOPY WITH PROPOFOL N/A 04/07/2019   Procedure: COLONOSCOPY WITH PROPOFOL;  Surgeon: Ronald Lobo, MD;  Location: [facility];  Service: Endoscopy;  Laterality: N/A;   EAR CYST EXCISION Left 02/02/2014   Procedure: LEFT THIGH SEBACEOUS CYST EXCISION 2.8 X 2 CM;  Surgeon: Rolm Bookbinder, MD;  Location: [place];  Service: General;  Laterality: Left;   HYSTEROSCOPY WITH D & C  06/11/1999   with exc. endometrial polyp   POLYPECTOMY  04/07/2019   Procedure: POLYPECTOMY;  Surgeon: Ronald Lobo, MD;  [facility]: [facility];  Service: Endoscopy;;   TONSILLECTOMY     TOTAL ABDOMINAL HYSTERECTOMY  10/08/2001   Patient Active Problem List   Diagnosis Date Noted   Type 2 diabetes mellitus ([place]) 09/08/2016   PCOS (polycystic ovarian syndrome) 09/08/2016    REFERRING DIAG: M54.50 (ICD-10-CM) - Low back pain, unspecified  THERAPY DIAG:  Other low back pain  Cramp and spasm  Muscle weakness (generalized)  Rationale for Evaluation and Treatment Rehabilitation  PERTINENT HISTORY: DM,  obesity  PRECAUTIONS: n/a  ONSET DATE: 1 year with flare-up in October 2023  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  I am doing the HEP and it has helped a little bit.     PAIN:   Are you having pain? Yes: NPRS scale: 1-2/10 when sitting, up to 6/10 with standing > 5 minutes  Pain location: bil low back  Pain description: constant and intense  Aggravating factors: standing/walking >5 minutes, laundry  Relieving factors: sitting down, Ibuprofen   OBJECTIVE: (objective measures completed at initial evaluation unless otherwise dated)   DIAGNOSTIC FINDINGS:  None   PATIENT SURVEYS:  FOTO 54 (goal is 65)   SCREENING FOR RED FLAGS: Bowel or bladder incontinence: No Spinal tumors: No Cauda equina syndrome: No Compression fracture: No Abdominal aneurysm: No   COGNITION: Overall cognitive status: Within functional limits for tasks assessed                          SENSATION: WFL   MUSCLE LENGTH: Limited by 25% bil without pain   POSTURE: forward head, decreased lumbar lordosis, and flexed trunk    PALPATION: Reduced PA mobility T10-L5, no pain.  Tension in bil lumbar paraspinals and bil gluteals    LUMBAR ROM:    Flexion and extension are full.  Sidebending reduced by 25% with pain at end range    LOWER EXTREMITY ROM:      WFLs without pain   LOWER EXTREMITY MMT:     Bil hips 4 to 4+/5, knees 4+/5 GAIT: Distance walked: 50 Assistive device utilized: None Level of assistance:  Complete Independence Comments: slow mobility with reduced lumbar trunk rotation   TODAY'S TREATMENT:                                                                                                                      04/22/22:  NuStep L5 x 5' PT present to give return to gym recommendations Pt education for return to gym: cut resistance down, no overhead press, use weight machines vs free weights for now, try NuStep for cardio (used to walk indoor track) Seated and supine fig 4 2x20" bil Supine hamstring stretch with strap 2x20" bil Lower trunk rotation bil 3x10" SKTC 3x10" bil (HEP) Supine pelvic tilt x10, add LE alt march to pelvic tilt with TA cued x 20 (HEP) SL clam 1x10 bil (HEP) Pt declined DN due to concern over skin condition Prone manual therapy: bil lumbar STM for elongation, lower thoracic PA and paraspinal broadening, bil glueals  04/07/2022 - initiated HEP     PATIENT EDUCATION:  Education details: Access Code: [email or web address] Person educated: Patient Education method: Explanation, Demonstration, and Handouts Education comprehension: verbalized understanding and returned demonstration   HOME EXERCISE PROGRAM: Access Code: [email or web address] URL: [email or web address] Date: 04/22/2022 Prepared by: Venetia Night Dalene Robards  Exercises - Supine Lower Trunk Rotation  - 3 x daily - 7 x weekly - 1 sets - 3 reps - 20 hold - Hooklying Single Knee to Chest  - 3 x  daily - 7 x weekly - 1 sets - 3 reps - 20 hold - Seated Hamstring Stretch  - 3 x daily - 7 x weekly - 1 sets - 3 reps - 20 hold - Seated Figure 4 Piriformis Stretch  - 3 x daily - 7 x weekly - 1 sets - 3 reps - 20 hold - Supine Figure 4 Piriformis Stretch  - 3 x daily - 7 x weekly - 1 sets - 3 reps - 20 hold - Supine Posterior Pelvic Tilt  - 1 x daily - 7 x weekly - 2 sets - 10 reps - Supine March with Posterior Pelvic Tilt  - 1 x daily - 7 x weekly - 2 sets - 10 reps - Clamshell  - 1 x daily - 7 x weekly - 2 sets - 10 reps    ASSESSMENT:   CLINICAL IMPRESSION: Patient reports reduced pain levels in both standing and sitting, although pain continues to limit standing and walking to 10-15 min.  Pt states HEP has helped to date.  PT introduced core which needed TC and VC initially for with pelvic tilts.  Progressed HEP and gave return to gym instructions.  Manual therapy without DN today for lumbar and thoracic soft tissue and joint tension (no DN due to Pt declining due to skin condition which can cause abscesses.)  F/U in 1-2 weeks to see how return to gym went and to progress core therex.   OBJECTIVE IMPAIRMENTS: decreased activity tolerance, decreased endurance, difficulty walking, increased muscle spasms, obesity, and pain.    ACTIVITY LIMITATIONS: carrying, lifting, standing, squatting, and locomotion level   PARTICIPATION LIMITATIONS: meal prep, cleaning, laundry, and community activity   PERSONAL FACTORS: 1 comorbidity: obesity  are also affecting patient's functional outcome.    REHAB POTENTIAL: Good   CLINICAL DECISION MAKING: Stable/uncomplicated   EVALUATION COMPLEXITY: Low     GOALS: Goals reviewed with patient? Yes   SHORT TERM GOALS: Target date: 05/05/2022     Be independent in initial HEP Baseline: Goal status: INITIAL   2.  Report > or = to 30% reduction in LBP with standing and walking tasks  Baseline: 8/10 Goal status: INITIAL   3.  Reduce pain to stand and walk for > or = to 10-15 minutes without limitation due to LBP Baseline: 5 min Goal status: INITIAL   4.  Initiate return to gym exercises and verbalize modifications for pain Baseline:  Goal status: INITIAL     LONG TERM GOALS: Target date: 06/02/2022     Be independent in advanced HEP Baseline:  Goal status: INITIAL   2.  Improve FOTO to > or = to 65 Baseline: 54 Goal status: INITIAL   3.  Report > or = to 70% reduction in LBP with standing and walking tasks  Baseline: 8/10 Goal status: INITIAL   4.  Reduce  pain to stand and walk for > or = to 20-25 minutes without limitation due to pain  Baseline:  Goal status: INITIAL   5.  Resume regular exercise routine to supplement HEP for strength and flexibility.  Baseline:  Goal status: INITIAL       PLAN:   PT FREQUENCY: 2x/week   PT DURATION: 8 weeks   PLANNED INTERVENTIONS: Therapeutic exercises, Therapeutic activity, Neuromuscular re-education, Balance training, Gait training, Patient/Family education, Self Care, Joint mobilization, Aquatic Therapy, Dry Needling, Cryotherapy, Moist heat, Taping, Traction, Manual therapy, and Re-evaluation.   PLAN FOR NEXT SESSION: review HEP, Nu Step, see how  return to gym went and to progress core therex.   Naria Abbey, PT 04/22/22 8:45 AM

## 2022-04-22 NOTE — Patient Instructions (Signed)

## 2022-04-25 ENCOUNTER — Other Ambulatory Visit (HOSPITAL_COMMUNITY): Payer: Self-pay

## 2022-04-25 MED ORDER — OZEMPIC (1 MG/DOSE) 4 MG/3ML ~~LOC~~ SOPN
1.0000 mg | PEN_INJECTOR | SUBCUTANEOUS | 2 refills | Status: AC
  Filled 2022-04-25: qty 3, 28d supply, fill #0
  Filled 2022-05-22: qty 3, 28d supply, fill #1

## 2022-04-28 ENCOUNTER — Other Ambulatory Visit (HOSPITAL_COMMUNITY): Payer: Self-pay

## 2022-05-05 ENCOUNTER — Ambulatory Visit: Payer: Commercial Managed Care - PPO

## 2022-05-05 DIAGNOSIS — R252 Cramp and spasm: Secondary | ICD-10-CM

## 2022-05-05 DIAGNOSIS — M6281 Muscle weakness (generalized): Secondary | ICD-10-CM | POA: Diagnosis not present

## 2022-05-05 DIAGNOSIS — M5459 Other low back pain: Secondary | ICD-10-CM | POA: Diagnosis not present

## 2022-05-05 NOTE — Therapy (Signed)
OUTPATIENT PHYSICAL THERAPY TREATMENT NOTE   Patient Name: Priscilla Webb MRN: [id number] DOB:1965-07-18, 57 y.o., female Today's Date: 05/05/2022  PCP: Kathyrn Lass, MD REFERRING PROVIDER:  Inez Catalina, MD  END OF SESSION:   PT End of Session - 05/05/22 0929     Visit Number 3    Date for PT Re-Evaluation 06/02/22    Authorization Type [employer]    PT Start Time 0850    PT Stop Time 0929    PT Time Calculation (min) 39 min    Activity Tolerance Patient tolerated treatment well    Behavior During Therapy [facility] for tasks assessed/performed              Past Medical History:  Diagnosis Date   Asthma    Diabetes mellitus without complication [facility])    Past Surgical History:  Procedure Laterality Date   BIOPSY  04/07/2019   Procedure: BIOPSY;  Surgeon: Ronald Lobo, MD;  [facility];  Service: Endoscopy;;   COLONOSCOPY     COLONOSCOPY WITH PROPOFOL N/A 04/07/2019   Procedure: COLONOSCOPY WITH PROPOFOL;  Surgeon: Ronald Lobo, MD;  [facility];  Service: Endoscopy;  Laterality: N/A;   EAR CYST EXCISION Left 02/02/2014   Procedure: LEFT THIGH SEBACEOUS CYST EXCISION 2.8 X 2 CM;  Surgeon: Rolm Bookbinder, MD;  Location: [place];  Service: General;  Laterality: Left;   HYSTEROSCOPY WITH D & C  06/11/1999   with exc. endometrial polyp   POLYPECTOMY  04/07/2019   Procedure: POLYPECTOMY;  Surgeon: Ronald Lobo, MD;  [facility];  Service: Endoscopy;;   TONSILLECTOMY     TOTAL ABDOMINAL HYSTERECTOMY  10/08/2001   Patient Active Problem List   Diagnosis Date Noted   Type 2 diabetes mellitus ([place]) 09/08/2016   PCOS (polycystic ovarian syndrome) 09/08/2016    REFERRING DIAG: M54.50 (ICD-10-CM) - Low back pain, unspecified  THERAPY DIAG:  Other low back pain  Cramp and spasm  Muscle weakness (generalized)  Rationale for Evaluation and Treatment Rehabilitation  PERTINENT HISTORY: DM, obesity,  hiradenitis suppurativa (skin condition that impacts armpits, groin, breasts)  PRECAUTIONS: n/a  ONSET DATE: 1 year with flare-up in October 2023  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  Prior to flare-up, I was 20% better.     PAIN:   Are you having pain? Yes: NPRS scale: 4/10 Pain location: bil low back  Pain description: constant and intense  Aggravating factors: standing/walking >5 minutes, laundry  Relieving factors: sitting down, Ibuprofen   OBJECTIVE: (objective measures completed at initial evaluation unless otherwise dated)   DIAGNOSTIC FINDINGS:  None   PATIENT SURVEYS:  FOTO 54 (goal is 29)   SCREENING FOR RED FLAGS: Bowel or bladder incontinence: No Spinal tumors: No Cauda equina syndrome: No Compression fracture: No Abdominal aneurysm: No   COGNITION: Overall cognitive status: Within functional limits for tasks assessed                          SENSATION: WFL   MUSCLE LENGTH: Limited by 25% bil without pain   POSTURE: forward head, decreased lumbar lordosis, and flexed trunk    PALPATION: Reduced PA mobility T10-L5, no pain.  Tension in bil lumbar paraspinals and bil gluteals    LUMBAR ROM:    Flexion and extension are full.  Sidebending reduced by 25% with pain at end range    LOWER EXTREMITY ROM:      WFLs without pain   LOWER EXTREMITY MMT:     Bil hips 4 to 4+/5, knees 4+/5 GAIT: Distance walked: 50 Assistive device utilized: None Level of  assistance: Complete Independence Comments: slow mobility with reduced lumbar trunk rotation   TODAY'S TREATMENT:                                                                                                                     05/05/22:  NuStep L5 x 6' PT present to monitor and discuss progress toward goals. Seated fig 4 2x20" bil Seated hamstring stretch 2x20" bil Lower trunk rotation bil 3x10" SKTC 3x20 bil  Supine pelvic tilt x10, add LE alt march to pelvic tilt with TA cued x 20  SL clam 1x10 bil-tactile cues  to reduce rocking  Manual therapy:  bil lumbar STM with addaday in Rt sidelying  04/22/22:  NuStep L5 x 5' PT present to give return to gym recommendations Pt education for return to gym: cut resistance down, no overhead press, use weight machines vs free weights for now, try NuStep for cardio (used to walk indoor track) Seated and supine fig 4 2x20" bil Supine hamstring stretch with strap 2x20" bil Lower trunk rotation bil 3x10" SKTC 3x10" bil (HEP) Supine pelvic tilt x10, add LE alt march to pelvic tilt with TA cued x 20 (HEP) SL clam 1x10 bil (HEP) Pt declined DN due to concern over skin condition Prone manual therapy: bil lumbar STM for elongation, lower thoracic PA and paraspinal broadening, bil glueals  04/07/2022 - initiated HEP     PATIENT EDUCATION:  Education details: Access Code: [email or web address] Person educated: Patient Education  method: Explanation, Demonstration, and Handouts Education comprehension: verbalized understanding and returned demonstration   HOME EXERCISE PROGRAM: Access Code: [email or web address] URL: [email or web address] Date: 04/22/2022 Prepared by: Venetia Night Beuhring  Exercises - Supine Lower Trunk Rotation  - 3 x daily - 7 x weekly - 1 sets - 3 reps - 20 hold - Hooklying Single Knee to Chest  - 3 x daily - 7 x weekly - 1 sets - 3 reps - 20 hold - Seated Hamstring Stretch  - 3 x daily - 7 x weekly - 1 sets - 3 reps - 20 hold - Seated  Figure 4 Piriformis Stretch  - 3 x daily - 7 x weekly - 1 sets - 3 reps - 20 hold - Supine Figure 4 Piriformis Stretch  - 3 x daily - 7 x weekly - 1 sets - 3 reps - 20 hold - Supine Posterior Pelvic Tilt  - 1 x daily - 7 x weekly - 2 sets - 10 reps - Supine March with Posterior Pelvic Tilt  - 1 x daily - 7 x weekly - 2 sets - 10 reps - Clamshell  - 1 x daily - 7 x weekly - 2 sets - 10 reps   ASSESSMENT:   CLINICAL IMPRESSION: Pt had a flare-up of a skin condition (hiradenitis suppurativa) last week and this has impacted her pain.  Prior to flare-up, she reports 20% overall improvement in her back pain. She is able to stand and walk with 5/10 max pain, compared to 8/10 at evaluation.  Pt reports that she is able to do laundry with less lumbar pain.  Pt is independent and compliant in her HEP for strength and flexibility.  PT provided verbal cues for technique with exercise in the clinic today.  Pt responded well to manual therapy.  Patient will benefit from skilled PT to address the below impairments and improve overall function.    OBJECTIVE IMPAIRMENTS: decreased activity tolerance, decreased endurance, difficulty walking, increased muscle spasms, obesity, and pain.    ACTIVITY LIMITATIONS: carrying, lifting, standing, squatting, and locomotion level   PARTICIPATION LIMITATIONS: meal prep, cleaning, laundry, and community activity   PERSONAL FACTORS: 1 comorbidity: obesity  are also affecting patient's functional outcome.    REHAB POTENTIAL: Good   CLINICAL DECISION MAKING: Stable/uncomplicated   EVALUATION COMPLEXITY: Low     GOALS: Goals reviewed with patient? Yes   SHORT TERM GOALS: Target date: 05/05/2022     Be independent in initial HEP Baseline: Goal status: MET   2.  Report > or = to 30% reduction in LBP with standing and walking tasks  Baseline: 5/10  and reports 20%  Goal status: In progress    3.  Reduce pain to stand and walk for > or = to 10-15 minutes without  limitation due to LBP Baseline: 5 min Goal status: INITIAL   4.  Initiate return to gym exercises and verbalize modifications for pain Baseline: not able to do due to flare-up of skin condition (05/05/22) Goal status: In progress      LONG TERM GOALS: Target date: 06/02/2022     Be independent in advanced HEP Baseline:  Goal status: INITIAL   2.  Improve FOTO to > or = to 65 Baseline: 54 Goal status: INITIAL   3.  Report > or = to 70% reduction in LBP with standing and walking tasks  Baseline: 8/10 Goal status: INITIAL   4.  Reduce pain to stand and walk for > or = to 20-25 minutes  without limitation due to pain  Baseline:  Goal status: INITIAL   5.  Resume regular exercise routine to supplement HEP for strength and flexibility.  Baseline:  Goal status: INITIAL       PLAN:   PT FREQUENCY: 2x/week   PT DURATION: 8 weeks   PLANNED INTERVENTIONS: Therapeutic exercises, Therapeutic activity, Neuromuscular re-education, Balance training, Gait training, Patient/Family education, Self Care, Joint mobilization, Aquatic Therapy, Dry Needling, Cryotherapy, Moist heat, Taping, Traction, Manual therapy, and Re-evaluation.   PLAN FOR NEXT SESSION:  Nu Step, see how return to gym went and to progress core therex.  Lt LE strength exercises    Sigurd Sos, PT 05/05/22 9:30 AM   [facility] [address] [place], [place] [address] Phone # [phone] Fax [phone]

## 2022-05-14 DIAGNOSIS — L732 Hidradenitis suppurativa: Secondary | ICD-10-CM | POA: Diagnosis not present

## 2022-05-20 ENCOUNTER — Ambulatory Visit: Payer: Commercial Managed Care - PPO

## 2022-05-20 DIAGNOSIS — M6281 Muscle weakness (generalized): Secondary | ICD-10-CM | POA: Diagnosis not present

## 2022-05-20 DIAGNOSIS — R252 Cramp and spasm: Secondary | ICD-10-CM | POA: Diagnosis not present

## 2022-05-20 DIAGNOSIS — M5459 Other low back pain: Secondary | ICD-10-CM

## 2022-05-20 NOTE — Therapy (Addendum)
OUTPATIENT PHYSICAL THERAPY TREATMENT NOTE   Patient Name: Priscilla Webb MRN: [id number] DOB:1966-03-17, 57 y.o., female Today's Date: 05/20/2022  PCP: Kathyrn Lass, MD REFERRING PROVIDER:  Inez Catalina, MD  END OF SESSION:   PT End of Session - 05/20/22 0840     Visit Number 4    Date for PT Re-Evaluation 06/02/22    Authorization Type [employer]    PT Start Time 0800    PT Stop Time 0841    PT Time Calculation (min) 41 min    Activity Tolerance Patient tolerated treatment well    Behavior During Therapy [facility] for tasks assessed/performed               Past Medical History:  Diagnosis Date   Asthma    Diabetes mellitus without complication [facility])    Past Surgical History:  Procedure Laterality Date   BIOPSY  04/07/2019   Procedure: BIOPSY;  Surgeon: Ronald Lobo, MD;  Location: [facility];  Service: Endoscopy;;   COLONOSCOPY     COLONOSCOPY WITH PROPOFOL N/A 04/07/2019   Procedure: COLONOSCOPY WITH PROPOFOL;  Surgeon: Ronald Lobo, MD;  Location: [facility];  Service: Endoscopy;  Laterality: N/A;   EAR CYST EXCISION Left 02/02/2014   Procedure: LEFT THIGH SEBACEOUS CYST EXCISION 2.8 X 2 CM;  Surgeon: Rolm Bookbinder, MD;  Location: [place];  Service: General;  Laterality: Left;   HYSTEROSCOPY WITH D & C  06/11/1999   with exc. endometrial polyp   POLYPECTOMY  04/07/2019   Procedure: POLYPECTOMY;  Surgeon: Ronald Lobo, MD;  Location: [facility];  Service: Endoscopy;;   TONSILLECTOMY     TOTAL ABDOMINAL HYSTERECTOMY  10/08/2001   Patient Active Problem List   Diagnosis Date Noted   Type 2 diabetes mellitus ([place]) 09/08/2016   PCOS (polycystic ovarian syndrome) 09/08/2016    REFERRING DIAG: M54.50 (ICD-10-CM) - Low back pain, unspecified  THERAPY DIAG:  Other low back pain  Cramp and spasm  Muscle weakness (generalized)  Rationale for Evaluation and Treatment Rehabilitation  PERTINENT HISTORY: DM, obesity,  hiradenitis suppurativa (skin condition that impacts armpits, groin, breasts)  PRECAUTIONS: n/a  ONSET DATE: 1 year with flare-up in October 2023  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  I've been to the gym twice and it went well.  I have soreness now.     PAIN:   Are you having pain? Yes: NPRS scale: 4/10 Pain location: bil low back  Pain description: constant and intense  Aggravating factors: standing/walking >5 minutes, laundry  Relieving factors: sitting down, Ibuprofen   OBJECTIVE: (objective measures completed at initial evaluation unless otherwise dated)   DIAGNOSTIC FINDINGS:  None   PATIENT SURVEYS:  FOTO 54 (goal is 68)   SCREENING FOR RED FLAGS: Bowel or bladder incontinence: No Spinal tumors: No Cauda equina syndrome: No Compression fracture: No Abdominal aneurysm: No   COGNITION: Overall cognitive status: Within functional limits for tasks assessed                          SENSATION: WFL   MUSCLE LENGTH: Limited by 25% bil without pain   POSTURE: forward head, decreased lumbar lordosis, and flexed trunk    PALPATION: Reduced PA mobility T10-L5, no pain.  Tension in bil lumbar paraspinals and bil gluteals    LUMBAR ROM:    Flexion and extension are full.  Sidebending reduced by 25% with pain at end range    LOWER EXTREMITY ROM:      WFLs without pain   LOWER EXTREMITY MMT:     Bil hips 4 to 4+/5, knees 4+/5 GAIT: Distance walked: 50 Assistive device utilized:  None Level of assistance: Complete Independence Comments: slow mobility with reduced lumbar trunk rotation   TODAY'S TREATMENT:         05/20/22:  NuStep L5 x 6' PT present to monitor and discuss progress toward goals. Seated fig 4 2x20" bil Ball roll out 10" hold x 5 Seated hamstring stretch 2x20" bil Lower trunk rotation bil 3x10" Seated press into foam roll 5" hold x10 TA activation with ball squeeze: 5" hold x20- good cocontraction SL clam 1x10 bil-improved technique with stability  Manual therapy:  bil lumbar STM with addaday in Rt sidelying                                                                                                              05/05/22:  NuStep L5 x 6' PT present to monitor and discuss progress toward goals. Seated fig 4 2x20" bil Seated hamstring stretch 2x20" bil Lower trunk rotation bil 3x10" SKTC 3x20 bil  Supine pelvic tilt x10, add LE alt march to pelvic tilt with TA cued x 20  SL clam 1x10 bil-tactile cues  to reduce rocking  Manual therapy:  bil lumbar STM with addaday in Rt sidelying  04/22/22:  NuStep L5 x 5' PT present to give return to gym recommendations Pt education for return to gym: cut resistance down, no overhead press, use weight machines vs free weights for now, try NuStep for cardio (used to walk indoor track) Seated and supine fig 4 2x20" bil Supine hamstring stretch  with strap 2x20" bil Lower trunk rotation bil 3x10" SKTC 3x10" bil (HEP) Supine pelvic tilt x10, add LE alt march to pelvic tilt with TA cued x 20 (HEP) SL clam 1x10 bil (HEP) Pt declined DN due to concern over skin condition Prone manual therapy: bil lumbar STM for elongation, lower thoracic PA and paraspinal broadening, bil glueals    PATIENT EDUCATION:  Education details: Access Code: [email or web address] Person educated: Patient Education method: Explanation, Demonstration, and Handouts Education comprehension: verbalized understanding and returned demonstration   HOME  EXERCISE PROGRAM: Access Code: [email or web address] URL: [email or web address] Date: 04/22/2022 Prepared by: Venetia Night Beuhring  Exercises - Supine Lower Trunk Rotation  - 3 x daily - 7 x weekly - 1 sets - 3 reps - 20 hold - Hooklying Single Knee to Chest  - 3 x daily - 7 x weekly - 1 sets - 3 reps - 20 hold - Seated Hamstring Stretch  - 3 x daily - 7 x weekly - 1 sets - 3 reps - 20 hold - Seated Figure 4 Piriformis Stretch  - 3 x daily - 7 x weekly - 1 sets - 3 reps - 20 hold - Supine Figure 4 Piriformis Stretch  - 3 x daily - 7 x weekly - 1 sets - 3 reps - 20 hold - Supine Posterior Pelvic Tilt  - 1 x daily - 7 x weekly - 2 sets - 10 reps - Supine March with Posterior Pelvic Tilt  - 1 x daily - 7 x weekly - 2 sets - 10 reps - Clamshell  - 1 x daily - 7 x weekly - 2 sets - 10 reps   ASSESSMENT:   CLINICAL IMPRESSION: Pt reports 30% overall improvement in symptoms since the start of care.  Pt has been very busy at work so not able to exercise as much as she wants.  She has been to the gym a couple of times and did well with gentle exercise.  Pt continues to be limited with standing and walking and reports pain after ~15 minutes in the grocery store this week.  PT provided verbal cues for technique with exercise in the clinic today.  Pt responded well to manual therapy.  Patient will benefit from skilled PT to address the below impairments and improve overall function.    OBJECTIVE IMPAIRMENTS: decreased activity tolerance, decreased endurance, difficulty walking, increased muscle spasms, obesity, and pain.    ACTIVITY LIMITATIONS: carrying, lifting, standing, squatting, and locomotion level   PARTICIPATION LIMITATIONS: meal prep, cleaning, laundry, and community activity   PERSONAL FACTORS: 1 comorbidity: obesity  are also affecting patient's functional outcome.    REHAB POTENTIAL: Good   CLINICAL DECISION MAKING: Stable/uncomplicated   EVALUATION COMPLEXITY: Low     GOALS: Goals  reviewed with patient? Yes   SHORT TERM GOALS: Target date: 05/05/2022     Be independent in initial HEP Baseline: Goal status: MET   2.  Report > or = to 30% reduction in LBP with standing and walking tasks  Baseline: 30% overall (05/20/22) Goal status: MET    3.  Reduce pain to stand and walk for > or = to 10-15 minutes without limitation due to LBP Baseline: pain in the grocery store at ~15 minutes (05/20/22) Goal status: In progress    4.  Initiate return to gym exercises and verbalize modifications for pain Baseline: has gone 2x and did well (05/20/22) Goal status: MET     LONG TERM GOALS: Target date: 06/02/2022  Be independent in advanced HEP Baseline:  Goal status: INITIAL   2.  Improve FOTO to > or = to 65 Baseline: 54 Goal status: INITIAL   3.  Report > or = to 70% reduction in LBP with standing and walking tasks  Baseline: 8/10 Goal status: INITIAL   4.  Reduce pain to stand and walk for > or = to 20-25 minutes without limitation due to pain  Baseline:  Goal status: INITIAL   5.  Resume regular exercise routine to supplement HEP for strength and flexibility.  Baseline:  Goal status: INITIAL       PLAN:   PT FREQUENCY: 2x/week   PT DURATION: 8 weeks   PLANNED INTERVENTIONS: Therapeutic exercises, Therapeutic activity, Neuromuscular re-education, Balance training, Gait training, Patient/Family education, Self Care, Joint mobilization, Aquatic Therapy, Dry Needling, Cryotherapy, Moist heat, Taping, Traction, Manual therapy, and Re-evaluation.   PLAN FOR NEXT SESSION:  Nu Step, see how return to gym went and to progress core therex.  Lt LE strength exercises.  ERO next    Sigurd Sos, PT 05/20/22 8:41 AM  PHYSICAL THERAPY DISCHARGE SUMMARY  Visits from Start of Care: 4  Current functional level related to goals / functional outcomes: Pt called to cancel remaining appts.  See above for most current status.    Remaining deficits: See above.     Education / Equipment: HEP, Economist    Patient agrees to discharge. Patient goals were partially met. Patient is being discharged due to the patient's request.  Sigurd Sos, PT 06/11/22 8:24 AM   [place] [address] [place], [place] [address] Phone # [phone] Fax [phone]

## 2022-05-22 ENCOUNTER — Other Ambulatory Visit (HOSPITAL_COMMUNITY): Payer: Self-pay

## 2022-06-10 ENCOUNTER — Other Ambulatory Visit (HOSPITAL_COMMUNITY): Payer: Self-pay

## 2022-06-11 ENCOUNTER — Ambulatory Visit: Payer: Commercial Managed Care - PPO

## 2022-06-18 ENCOUNTER — Other Ambulatory Visit (HOSPITAL_COMMUNITY): Payer: Self-pay

## 2022-06-18 DIAGNOSIS — Z713 Dietary counseling and surveillance: Secondary | ICD-10-CM | POA: Diagnosis not present

## 2022-06-18 DIAGNOSIS — E1169 Type 2 diabetes mellitus with other specified complication: Secondary | ICD-10-CM | POA: Diagnosis not present

## 2022-06-18 DIAGNOSIS — Z6841 Body Mass Index (BMI) 40.0 and over, adult: Secondary | ICD-10-CM | POA: Diagnosis not present

## 2022-06-18 MED ORDER — OZEMPIC (2 MG/DOSE) 8 MG/3ML ~~LOC~~ SOPN
2.0000 mg | PEN_INJECTOR | SUBCUTANEOUS | 2 refills | Status: AC
  Filled 2022-06-18 – 2022-06-24 (×2): qty 3, 28d supply, fill #0
  Filled 2022-07-22: qty 3, 28d supply, fill #1
  Filled 2022-08-14: qty 3, 28d supply, fill #2

## 2022-06-19 ENCOUNTER — Other Ambulatory Visit (HOSPITAL_COMMUNITY): Payer: Self-pay

## 2022-06-23 ENCOUNTER — Other Ambulatory Visit (HOSPITAL_COMMUNITY): Payer: Self-pay

## 2022-06-24 ENCOUNTER — Other Ambulatory Visit (HOSPITAL_COMMUNITY): Payer: Self-pay

## 2022-07-09 DIAGNOSIS — L732 Hidradenitis suppurativa: Secondary | ICD-10-CM | POA: Diagnosis not present

## 2022-07-22 ENCOUNTER — Other Ambulatory Visit (HOSPITAL_COMMUNITY): Payer: Self-pay

## 2022-07-22 ENCOUNTER — Other Ambulatory Visit: Payer: Self-pay

## 2022-08-14 ENCOUNTER — Other Ambulatory Visit (HOSPITAL_COMMUNITY): Payer: Self-pay

## 2022-09-08 ENCOUNTER — Other Ambulatory Visit: Payer: Self-pay

## 2022-09-08 ENCOUNTER — Other Ambulatory Visit (HOSPITAL_COMMUNITY): Payer: Self-pay

## 2022-09-09 ENCOUNTER — Other Ambulatory Visit (HOSPITAL_COMMUNITY): Payer: Self-pay

## 2022-09-09 DIAGNOSIS — L732 Hidradenitis suppurativa: Secondary | ICD-10-CM | POA: Diagnosis not present

## 2022-09-09 DIAGNOSIS — Z5181 Encounter for therapeutic drug level monitoring: Secondary | ICD-10-CM | POA: Diagnosis not present

## 2022-09-09 MED ORDER — DAPSONE 25 MG PO TABS
75.0000 mg | ORAL_TABLET | Freq: Every day | ORAL | 2 refills | Status: AC
  Filled 2022-09-09: qty 270, 90d supply, fill #0

## 2022-09-09 MED ORDER — SPIRONOLACTONE 50 MG PO TABS
ORAL_TABLET | ORAL | 6 refills | Status: AC
  Filled 2022-09-09: qty 60, 30d supply, fill #0

## 2022-09-09 MED ORDER — CLINDAMYCIN HCL 300 MG PO CAPS
300.0000 mg | ORAL_CAPSULE | Freq: Two times a day (BID) | ORAL | 5 refills | Status: AC
  Filled 2022-09-09: qty 60, 30d supply, fill #0

## 2022-09-09 MED ORDER — SILVER SULFADIAZINE 1 % EX CREA
TOPICAL_CREAM | CUTANEOUS | 5 refills | Status: AC
  Filled 2022-09-09: qty 250, 30d supply, fill #0

## 2022-09-10 ENCOUNTER — Other Ambulatory Visit (HOSPITAL_COMMUNITY): Payer: Self-pay

## 2022-09-12 ENCOUNTER — Other Ambulatory Visit (HOSPITAL_COMMUNITY): Payer: Self-pay

## 2022-09-17 ENCOUNTER — Other Ambulatory Visit: Payer: Self-pay

## 2022-09-17 ENCOUNTER — Other Ambulatory Visit (HOSPITAL_COMMUNITY): Payer: Self-pay

## 2022-09-17 MED ORDER — COSENTYX UNOREADY 300 MG/2ML ~~LOC~~ SOAJ: SUBCUTANEOUS | 0 refills | Status: DC

## 2022-09-17 MED ORDER — COSENTYX UNOREADY 300 MG/2ML ~~LOC~~ SOAJ: SUBCUTANEOUS | 5 refills | Status: DC

## 2022-09-18 ENCOUNTER — Other Ambulatory Visit (HOSPITAL_COMMUNITY): Payer: Self-pay

## 2022-09-18 ENCOUNTER — Ambulatory Visit: Payer: Commercial Managed Care - PPO | Attending: Family Medicine | Admitting: Pharmacist

## 2022-09-18 ENCOUNTER — Other Ambulatory Visit: Payer: Self-pay

## 2022-09-18 ENCOUNTER — Telehealth: Payer: Self-pay | Admitting: Pharmacist

## 2022-09-18 DIAGNOSIS — Z79899 Other long term (current) drug therapy: Secondary | ICD-10-CM

## 2022-09-18 MED ORDER — COSENTYX UNOREADY 300 MG/2ML ~~LOC~~ SOAJ
SUBCUTANEOUS | 5 refills | Status: DC
  Filled 2022-09-18 – 2022-10-31 (×2): qty 2, 28d supply, fill #0
  Filled 2022-11-26: qty 2, 28d supply, fill #1

## 2022-09-18 MED ORDER — COSENTYX UNOREADY 300 MG/2ML ~~LOC~~ SOAJ
SUBCUTANEOUS | 0 refills | Status: DC
  Filled 2022-09-18: qty 6, fill #0
  Filled 2022-09-19: qty 6, 28d supply, fill #0

## 2022-09-18 NOTE — Telephone Encounter (Signed)
Called patient to schedule an appointment for the [employer] [facility]. I was unable to reach the patient so I left a HIPAA-compliant message requesting that the patient return my call.   Luke Van Ausdall, PharmD, BCACP, CPP Clinical Pharmacist [facility] [phone]  

## 2022-09-18 NOTE — Progress Notes (Signed)
   S: Patient presents today for review of their specialty medication.   Patient is currently taking Cosentyx (secukinumab) for HS. Patient is managed by [facility] for this. Two loading doses completed. She will complete a total of 5 LD before she starts on her mt dose.   Dosing: HS: SubQ: 300 mg once weekly at weeks 0, 1, 2, 3, and 4 followed by 300 mg every 4 weeks. Some patients may only require 300 mg once every 2 weeks.  Adherence: confirms  Efficacy: none yet but she is still completing her loading dose.  Current adverse effects: S/sx of infection: none GI upset: none Headache: none S/sx of hypersensitivity: none    O:     Lab Results  Component Value Date   WBC 7.3 01/30/2014   HGB 15.0 02/02/2014   HCT 42.3 01/30/2014   MCV 92.0 01/30/2014   PLT 252 01/30/2014      Chemistry      Component Value Date/Time   NA 138 01/30/2014 0830   K 4.2 01/30/2014 0830   CL 97 01/30/2014 0830   CO2 26 01/30/2014 0830   BUN 12 01/30/2014 0830   CREATININE 0.59 01/30/2014 0830      Component Value Date/Time   CALCIUM 8.9 01/30/2014 0830   ALKPHOS 76 11/29/2011 0250   AST 19 11/29/2011 0250   ALT 26 11/29/2011 0250   BILITOT 0.3 11/29/2011 0250       A/P: 1. Medication review: Patient is currently on Cosentyx for HS and is tolerating it well. Reviewed the medication with the patient, including the following: Cosentyx is a monoclonal antibody used in the treatment of ankylosing spondylitis, psoriasis, and psoriatic arthritis. The injection is subq and the medication should be allowed to reach room temp prior to injecting. Injection sites should be rotated. Possible adverse effects include headaches, GI upset, increased risk of infection and hypersensitivity reactions. No recommendations for any changes at this time.   Butch Penny, PharmD, Patsy [place], CPP Clinical Pharmacist [facility] & [facility] [phone]

## 2022-09-19 ENCOUNTER — Other Ambulatory Visit: Payer: Self-pay | Admitting: Pharmacist

## 2022-09-19 ENCOUNTER — Other Ambulatory Visit: Payer: Self-pay

## 2022-09-19 ENCOUNTER — Other Ambulatory Visit (HOSPITAL_COMMUNITY): Payer: Self-pay

## 2022-09-19 MED ORDER — COSENTYX UNOREADY 300 MG/2ML ~~LOC~~ SOAJ
SUBCUTANEOUS | 0 refills | Status: DC
  Filled 2022-09-19: qty 8, fill #0
  Filled 2022-10-09: qty 2, 28d supply, fill #0

## 2022-09-22 ENCOUNTER — Other Ambulatory Visit: Payer: Self-pay

## 2022-09-22 ENCOUNTER — Other Ambulatory Visit (HOSPITAL_COMMUNITY): Payer: Self-pay

## 2022-09-22 MED ORDER — OZEMPIC (2 MG/DOSE) 8 MG/3ML ~~LOC~~ SOPN
2.0000 mg | PEN_INJECTOR | SUBCUTANEOUS | 0 refills | Status: DC
  Filled 2022-09-22: qty 3, 28d supply, fill #0

## 2022-09-23 ENCOUNTER — Other Ambulatory Visit (HOSPITAL_COMMUNITY): Payer: Self-pay

## 2022-09-26 ENCOUNTER — Other Ambulatory Visit: Payer: Self-pay

## 2022-10-09 ENCOUNTER — Other Ambulatory Visit (HOSPITAL_COMMUNITY): Payer: Self-pay

## 2022-10-09 ENCOUNTER — Other Ambulatory Visit: Payer: Self-pay

## 2022-10-10 ENCOUNTER — Other Ambulatory Visit: Payer: Self-pay

## 2022-10-10 ENCOUNTER — Other Ambulatory Visit (HOSPITAL_COMMUNITY): Payer: Self-pay

## 2022-10-17 ENCOUNTER — Other Ambulatory Visit (HOSPITAL_COMMUNITY): Payer: Self-pay

## 2022-10-17 MED ORDER — OZEMPIC (2 MG/DOSE) 8 MG/3ML ~~LOC~~ SOPN
PEN_INJECTOR | SUBCUTANEOUS | 1 refills | Status: DC
  Filled 2022-10-17: qty 3, 28d supply, fill #0
  Filled 2022-11-19: qty 3, 28d supply, fill #1

## 2022-10-18 ENCOUNTER — Other Ambulatory Visit (HOSPITAL_COMMUNITY): Payer: Self-pay

## 2022-10-29 ENCOUNTER — Other Ambulatory Visit (HOSPITAL_COMMUNITY): Payer: Self-pay

## 2022-10-31 ENCOUNTER — Other Ambulatory Visit: Payer: Self-pay

## 2022-10-31 ENCOUNTER — Other Ambulatory Visit (HOSPITAL_COMMUNITY): Payer: Self-pay

## 2022-11-04 ENCOUNTER — Other Ambulatory Visit (HOSPITAL_COMMUNITY): Payer: Self-pay

## 2022-11-20 ENCOUNTER — Other Ambulatory Visit (HOSPITAL_COMMUNITY): Payer: Self-pay

## 2022-11-26 ENCOUNTER — Other Ambulatory Visit (HOSPITAL_COMMUNITY): Payer: Self-pay

## 2022-12-02 ENCOUNTER — Other Ambulatory Visit: Payer: Self-pay

## 2022-12-08 ENCOUNTER — Other Ambulatory Visit: Payer: Self-pay | Admitting: Pharmacist

## 2022-12-08 ENCOUNTER — Other Ambulatory Visit: Payer: Self-pay

## 2022-12-08 ENCOUNTER — Other Ambulatory Visit (HOSPITAL_COMMUNITY): Payer: Self-pay

## 2022-12-08 MED ORDER — COSENTYX UNOREADY 300 MG/2ML ~~LOC~~ SOAJ
SUBCUTANEOUS | 5 refills | Status: DC
  Filled 2022-12-08: qty 4, fill #0
  Filled 2022-12-18: qty 4, 28d supply, fill #0
  Filled 2023-01-08: qty 4, 28d supply, fill #1
  Filled 2023-02-16: qty 4, 28d supply, fill #2
  Filled 2023-03-06: qty 4, 28d supply, fill #3
  Filled 2023-04-08 – 2023-04-21 (×2): qty 4, 28d supply, fill #4
  Filled 2023-05-13: qty 4, 28d supply, fill #5

## 2022-12-08 MED ORDER — COSENTYX UNOREADY 300 MG/2ML ~~LOC~~ SOAJ
SUBCUTANEOUS | 5 refills | Status: DC
  Filled 2022-12-08: qty 4, 28d supply, fill #0

## 2022-12-15 ENCOUNTER — Other Ambulatory Visit: Payer: Self-pay

## 2022-12-18 ENCOUNTER — Other Ambulatory Visit (HOSPITAL_COMMUNITY): Payer: Self-pay

## 2022-12-18 ENCOUNTER — Other Ambulatory Visit: Payer: Self-pay

## 2022-12-19 ENCOUNTER — Other Ambulatory Visit: Payer: Self-pay

## 2022-12-23 ENCOUNTER — Other Ambulatory Visit (HOSPITAL_COMMUNITY): Payer: Self-pay

## 2022-12-25 ENCOUNTER — Other Ambulatory Visit (HOSPITAL_COMMUNITY): Payer: Self-pay

## 2022-12-26 ENCOUNTER — Other Ambulatory Visit: Payer: Self-pay

## 2022-12-26 ENCOUNTER — Other Ambulatory Visit (HOSPITAL_COMMUNITY): Payer: Self-pay

## 2022-12-26 MED ORDER — OZEMPIC (2 MG/DOSE) 8 MG/3ML ~~LOC~~ SOPN
2.0000 mg | PEN_INJECTOR | SUBCUTANEOUS | 0 refills | Status: DC
  Filled 2022-12-26: qty 3, 28d supply, fill #0

## 2023-01-01 ENCOUNTER — Other Ambulatory Visit (HOSPITAL_COMMUNITY): Payer: Self-pay

## 2023-01-01 MED ORDER — CLINDAMYCIN HCL 300 MG PO CAPS
300.0000 mg | ORAL_CAPSULE | Freq: Two times a day (BID) | ORAL | 5 refills | Status: AC
  Filled 2023-01-01: qty 60, 30d supply, fill #0

## 2023-01-02 ENCOUNTER — Other Ambulatory Visit (HOSPITAL_COMMUNITY): Payer: Self-pay

## 2023-01-07 DIAGNOSIS — Z6841 Body Mass Index (BMI) 40.0 and over, adult: Secondary | ICD-10-CM | POA: Diagnosis not present

## 2023-01-07 DIAGNOSIS — E559 Vitamin D deficiency, unspecified: Secondary | ICD-10-CM | POA: Diagnosis not present

## 2023-01-07 DIAGNOSIS — K760 Fatty (change of) liver, not elsewhere classified: Secondary | ICD-10-CM | POA: Diagnosis not present

## 2023-01-07 DIAGNOSIS — E7800 Pure hypercholesterolemia, unspecified: Secondary | ICD-10-CM | POA: Diagnosis not present

## 2023-01-07 DIAGNOSIS — Z23 Encounter for immunization: Secondary | ICD-10-CM | POA: Diagnosis not present

## 2023-01-07 DIAGNOSIS — E1169 Type 2 diabetes mellitus with other specified complication: Secondary | ICD-10-CM | POA: Diagnosis not present

## 2023-01-07 DIAGNOSIS — I10 Essential (primary) hypertension: Secondary | ICD-10-CM | POA: Diagnosis not present

## 2023-01-08 ENCOUNTER — Other Ambulatory Visit (HOSPITAL_COMMUNITY): Payer: Self-pay

## 2023-01-10 ENCOUNTER — Encounter (HOSPITAL_COMMUNITY): Payer: Self-pay

## 2023-01-13 ENCOUNTER — Other Ambulatory Visit (HOSPITAL_COMMUNITY): Payer: Self-pay

## 2023-01-13 DIAGNOSIS — Z5181 Encounter for therapeutic drug level monitoring: Secondary | ICD-10-CM | POA: Diagnosis not present

## 2023-01-13 DIAGNOSIS — L732 Hidradenitis suppurativa: Secondary | ICD-10-CM | POA: Diagnosis not present

## 2023-01-13 MED ORDER — DAPSONE 25 MG PO TABS
75.0000 mg | ORAL_TABLET | Freq: Every day | ORAL | 2 refills | Status: AC
  Filled 2023-01-13: qty 270, 90d supply, fill #0

## 2023-01-13 MED ORDER — GABAPENTIN 100 MG PO CAPS
200.0000 mg | ORAL_CAPSULE | Freq: Every day | ORAL | 5 refills | Status: AC
  Filled 2023-01-13: qty 60, 30d supply, fill #0

## 2023-01-13 MED ORDER — CLINDAMYCIN HCL 300 MG PO CAPS: 300.0000 mg | ORAL_CAPSULE | Freq: Two times a day (BID) | ORAL | 5 refills | Status: AC

## 2023-01-13 MED ORDER — SPIRONOLACTONE 50 MG PO TABS
50.0000 mg | ORAL_TABLET | Freq: Two times a day (BID) | ORAL | 6 refills | Status: AC
  Filled 2023-01-13: qty 60, 30d supply, fill #0

## 2023-01-13 MED ORDER — SILVER SULFADIAZINE 1 % EX CREA
TOPICAL_CREAM | CUTANEOUS | 6 refills | Status: AC
  Filled 2023-01-13: qty 200, 28d supply, fill #0

## 2023-01-13 MED ORDER — FLUCONAZOLE 100 MG PO TABS
100.0000 mg | ORAL_TABLET | ORAL | 0 refills | Status: AC
  Filled 2023-01-13: qty 12, 84d supply, fill #0

## 2023-01-14 ENCOUNTER — Other Ambulatory Visit (HOSPITAL_COMMUNITY): Payer: Self-pay

## 2023-01-15 ENCOUNTER — Other Ambulatory Visit (HOSPITAL_COMMUNITY): Payer: Self-pay

## 2023-01-20 ENCOUNTER — Other Ambulatory Visit (HOSPITAL_COMMUNITY): Payer: Self-pay

## 2023-01-23 ENCOUNTER — Other Ambulatory Visit (HOSPITAL_COMMUNITY): Payer: Self-pay

## 2023-01-26 ENCOUNTER — Other Ambulatory Visit (HOSPITAL_COMMUNITY): Payer: Self-pay

## 2023-01-26 MED ORDER — OZEMPIC (2 MG/DOSE) 8 MG/3ML ~~LOC~~ SOPN
2.0000 mg | PEN_INJECTOR | SUBCUTANEOUS | 1 refills | Status: DC
  Filled 2023-01-26: qty 3, 28d supply, fill #0
  Filled 2023-03-02: qty 3, 28d supply, fill #1

## 2023-02-05 ENCOUNTER — Other Ambulatory Visit (HOSPITAL_COMMUNITY): Payer: Self-pay

## 2023-02-09 ENCOUNTER — Other Ambulatory Visit (HOSPITAL_COMMUNITY): Payer: Self-pay

## 2023-02-09 MED ORDER — ATORVASTATIN CALCIUM 20 MG PO TABS
20.0000 mg | ORAL_TABLET | Freq: Every day | ORAL | 0 refills | Status: DC
  Filled 2023-02-09: qty 90, 90d supply, fill #0

## 2023-02-10 ENCOUNTER — Other Ambulatory Visit: Payer: Self-pay

## 2023-02-13 ENCOUNTER — Other Ambulatory Visit: Payer: Self-pay

## 2023-02-16 ENCOUNTER — Other Ambulatory Visit: Payer: Self-pay

## 2023-02-16 ENCOUNTER — Encounter (HOSPITAL_COMMUNITY): Payer: Self-pay

## 2023-02-16 NOTE — Progress Notes (Signed)
Specialty Pharmacy Refill Coordination Note  ROBYN GARANT is a 57 y.o. female contacted today regarding refills of specialty medication(s) Secukinumab   Patient requested [name] at [facility] at [place] date: 02/18/23   Medication will be filled on 02/17/23.

## 2023-02-16 NOTE — Progress Notes (Signed)
Specialty Pharmacy Ongoing Clinical Assessment Note  Priscilla Webb is a 57 y.o. female who is being followed by the specialty pharmacy service for RxSp Hidradenitis Suppurativa   Patient's specialty medication(s) reviewed today: Secukinumab   Missed doses in the last 4 weeks: 0   Patient/Caregiver did not have any additional questions or concerns.   Therapeutic benefit summary: Patient is achieving benefit   Adverse events/side effects summary: No adverse events/side effects   Patient's therapy is appropriate to: Continue    Goals Addressed             This Visit's Progress    Reduce signs and symptoms       Patient is on track. Patient will maintain adherence         Follow up:  6 months  Otto Herb Specialty Pharmacist

## 2023-02-17 ENCOUNTER — Other Ambulatory Visit (HOSPITAL_COMMUNITY): Payer: Self-pay

## 2023-02-18 ENCOUNTER — Other Ambulatory Visit (HOSPITAL_BASED_OUTPATIENT_CLINIC_OR_DEPARTMENT_OTHER): Payer: Self-pay

## 2023-02-18 ENCOUNTER — Other Ambulatory Visit (HOSPITAL_COMMUNITY): Payer: Self-pay

## 2023-02-18 MED ORDER — FLULAVAL 0.5 ML IM SUSY
0.5000 mL | PREFILLED_SYRINGE | Freq: Once | INTRAMUSCULAR | 0 refills | Status: AC
  Filled 2023-02-18: qty 0.5, 1d supply, fill #0

## 2023-03-02 ENCOUNTER — Other Ambulatory Visit (HOSPITAL_COMMUNITY): Payer: Self-pay

## 2023-03-03 ENCOUNTER — Other Ambulatory Visit (HOSPITAL_COMMUNITY): Payer: Self-pay

## 2023-03-06 ENCOUNTER — Other Ambulatory Visit: Payer: Self-pay

## 2023-03-06 NOTE — Progress Notes (Signed)
Specialty Pharmacy Refill Coordination Note  Priscilla Webb is a 57 y.o. female contacted today regarding refills of specialty medication(s) Secukinumab   Patient requested Priscilla Webb at [facility] at [place] date: 03/17/23   Medication will be filled on 03/16/23.

## 2023-03-16 ENCOUNTER — Other Ambulatory Visit: Payer: Self-pay

## 2023-03-17 ENCOUNTER — Other Ambulatory Visit (HOSPITAL_COMMUNITY): Payer: Self-pay

## 2023-03-24 ENCOUNTER — Other Ambulatory Visit (HOSPITAL_COMMUNITY): Payer: Self-pay

## 2023-03-25 ENCOUNTER — Other Ambulatory Visit (HOSPITAL_COMMUNITY): Payer: Self-pay

## 2023-03-30 ENCOUNTER — Other Ambulatory Visit (HOSPITAL_COMMUNITY): Payer: Self-pay

## 2023-03-30 DIAGNOSIS — E7800 Pure hypercholesterolemia, unspecified: Secondary | ICD-10-CM | POA: Diagnosis not present

## 2023-03-30 DIAGNOSIS — I10 Essential (primary) hypertension: Secondary | ICD-10-CM | POA: Diagnosis not present

## 2023-03-30 DIAGNOSIS — Z0000 Encounter for general adult medical examination without abnormal findings: Secondary | ICD-10-CM | POA: Diagnosis not present

## 2023-03-30 DIAGNOSIS — E1169 Type 2 diabetes mellitus with other specified complication: Secondary | ICD-10-CM | POA: Diagnosis not present

## 2023-04-01 ENCOUNTER — Other Ambulatory Visit (HOSPITAL_COMMUNITY): Payer: Self-pay

## 2023-04-01 MED ORDER — OZEMPIC (2 MG/DOSE) 8 MG/3ML ~~LOC~~ SOPN
2.0000 mg | PEN_INJECTOR | SUBCUTANEOUS | 0 refills | Status: AC
  Filled 2023-04-01: qty 9, 84d supply, fill #0
  Filled 2023-04-02: qty 3, 28d supply, fill #0
  Filled 2023-04-28: qty 3, 28d supply, fill #1
  Filled 2023-05-26: qty 3, 28d supply, fill #2

## 2023-04-02 ENCOUNTER — Other Ambulatory Visit (HOSPITAL_COMMUNITY): Payer: Self-pay

## 2023-04-08 ENCOUNTER — Other Ambulatory Visit: Payer: Self-pay

## 2023-04-08 NOTE — Progress Notes (Signed)
Specialty Pharmacy Refill Coordination Note  Priscilla Webb is a 57 y.o. female contacted today regarding refills of specialty medication(s) Secukinumab (Cosentyx Priscilla Webb)   Patient requested Priscilla Webb at [facility] at [place] date: 04/10/23   Medication will be filled on 04/09/23.

## 2023-04-09 ENCOUNTER — Other Ambulatory Visit (HOSPITAL_COMMUNITY): Payer: Self-pay

## 2023-04-09 ENCOUNTER — Other Ambulatory Visit: Payer: Self-pay

## 2023-04-18 ENCOUNTER — Other Ambulatory Visit (HOSPITAL_COMMUNITY): Payer: Self-pay

## 2023-04-20 ENCOUNTER — Other Ambulatory Visit: Payer: Self-pay

## 2023-04-20 ENCOUNTER — Other Ambulatory Visit (HOSPITAL_COMMUNITY): Payer: Self-pay

## 2023-04-21 ENCOUNTER — Other Ambulatory Visit (HOSPITAL_COMMUNITY): Payer: Self-pay

## 2023-04-21 NOTE — Progress Notes (Signed)
Patient called back, she forgot to pickup, but plans to come pickup on 1/02, filling today (12/31).

## 2023-04-24 ENCOUNTER — Other Ambulatory Visit (HOSPITAL_COMMUNITY): Payer: Self-pay

## 2023-04-29 ENCOUNTER — Other Ambulatory Visit (HOSPITAL_COMMUNITY): Payer: Self-pay

## 2023-05-13 ENCOUNTER — Other Ambulatory Visit: Payer: Self-pay

## 2023-05-13 NOTE — Progress Notes (Signed)
Specialty Pharmacy Refill Coordination Note  Priscilla Webb is a 58 y.o. female contacted today regarding refills of specialty medication(s) Secukinumab (Cosentyx Priscilla Webb)   Patient requested Priscilla Webb at [facility] at [place] date: 05/19/23   Medication will be filled on 05/18/23.

## 2023-05-18 ENCOUNTER — Other Ambulatory Visit: Payer: Self-pay

## 2023-05-19 ENCOUNTER — Other Ambulatory Visit (HOSPITAL_COMMUNITY): Payer: Self-pay

## 2023-05-19 ENCOUNTER — Other Ambulatory Visit: Payer: Self-pay

## 2023-05-19 DIAGNOSIS — Z5181 Encounter for therapeutic drug level monitoring: Secondary | ICD-10-CM | POA: Diagnosis not present

## 2023-05-19 DIAGNOSIS — L732 Hidradenitis suppurativa: Secondary | ICD-10-CM | POA: Diagnosis not present

## 2023-05-19 MED ORDER — MOXIFLOXACIN HCL 400 MG PO TABS
400.0000 mg | ORAL_TABLET | Freq: Every day | ORAL | 0 refills | Status: AC
  Filled 2023-05-19: qty 90, 90d supply, fill #0

## 2023-05-19 MED ORDER — TRIAMCINOLONE ACETONIDE 0.1 % EX CREA
TOPICAL_CREAM | CUTANEOUS | 3 refills | Status: AC
  Filled 2023-05-19: qty 454, 45d supply, fill #0
  Filled 2023-11-09: qty 465, fill #1

## 2023-05-19 MED ORDER — FLUCONAZOLE 100 MG PO TABS
100.0000 mg | ORAL_TABLET | ORAL | 1 refills | Status: AC
  Filled 2023-05-19: qty 12, 84d supply, fill #0
  Filled 2023-08-17: qty 12, 84d supply, fill #1

## 2023-05-19 MED ORDER — SILVER SULFADIAZINE 1 % EX CREA
TOPICAL_CREAM | CUTANEOUS | 6 refills | Status: AC
  Filled 2023-05-19: qty 400, 30d supply, fill #0
  Filled 2023-11-09: qty 400, 30d supply, fill #1

## 2023-05-19 MED ORDER — SPIRONOLACTONE 50 MG PO TABS
50.0000 mg | ORAL_TABLET | Freq: Two times a day (BID) | ORAL | 6 refills | Status: AC
  Filled 2023-05-19: qty 60, 30d supply, fill #0
  Filled 2023-07-20: qty 60, 30d supply, fill #1
  Filled 2023-09-22: qty 60, 30d supply, fill #2
  Filled 2023-11-06: qty 60, 30d supply, fill #3
  Filled 2024-02-20: qty 60, 30d supply, fill #4
  Filled 2024-04-26: qty 60, 30d supply, fill #5

## 2023-05-19 MED ORDER — ATORVASTATIN CALCIUM 20 MG PO TABS
20.0000 mg | ORAL_TABLET | Freq: Every day | ORAL | 1 refills | Status: DC
  Filled 2023-05-19: qty 90, 90d supply, fill #0
  Filled 2023-08-26: qty 90, 90d supply, fill #1

## 2023-05-19 MED ORDER — GABAPENTIN 100 MG PO CAPS
200.0000 mg | ORAL_CAPSULE | Freq: Every day | ORAL | 5 refills | Status: AC
  Filled 2023-05-19: qty 60, 30d supply, fill #0

## 2023-05-20 ENCOUNTER — Other Ambulatory Visit (HOSPITAL_COMMUNITY): Payer: Self-pay

## 2023-05-20 ENCOUNTER — Other Ambulatory Visit: Payer: Self-pay

## 2023-05-21 ENCOUNTER — Other Ambulatory Visit (HOSPITAL_COMMUNITY): Payer: Self-pay

## 2023-05-26 ENCOUNTER — Other Ambulatory Visit (HOSPITAL_COMMUNITY): Payer: Self-pay

## 2023-06-09 ENCOUNTER — Other Ambulatory Visit (HOSPITAL_COMMUNITY): Payer: Self-pay

## 2023-06-09 NOTE — Progress Notes (Signed)
Specialty Pharmacy Refill Coordination Note  Priscilla Webb is a 58 y.o. female contacted today regarding refills of specialty medication(s) Secukinumab (Cosentyx [name])   Patient requested [name] at [facility] at [place] date: 06/17/23   Medication will be filled on 06/16/23. This fill date is pending response to refill request from provider. Patient is aware and if they have not received fill by intended date they must follow up with pharmacy.

## 2023-06-10 ENCOUNTER — Other Ambulatory Visit (HOSPITAL_COMMUNITY): Payer: Self-pay

## 2023-06-10 ENCOUNTER — Other Ambulatory Visit: Payer: Self-pay

## 2023-06-10 ENCOUNTER — Other Ambulatory Visit: Payer: Self-pay | Admitting: Pharmacist

## 2023-06-10 MED ORDER — COSENTYX UNOREADY 300 MG/2ML ~~LOC~~ SOAJ
SUBCUTANEOUS | 2 refills | Status: DC
  Filled 2023-06-10: qty 4, 28d supply, fill #0
  Filled 2023-07-15: qty 4, 28d supply, fill #1
  Filled 2023-08-10: qty 4, 28d supply, fill #2

## 2023-06-10 MED ORDER — COSENTYX UNOREADY 300 MG/2ML ~~LOC~~ SOAJ: SUBCUTANEOUS | 2 refills | Status: DC

## 2023-06-16 ENCOUNTER — Other Ambulatory Visit: Payer: Self-pay

## 2023-06-22 ENCOUNTER — Other Ambulatory Visit: Payer: Self-pay

## 2023-06-24 ENCOUNTER — Other Ambulatory Visit (HOSPITAL_COMMUNITY): Payer: Self-pay

## 2023-06-25 ENCOUNTER — Other Ambulatory Visit (HOSPITAL_COMMUNITY): Payer: Self-pay

## 2023-06-26 ENCOUNTER — Other Ambulatory Visit (HOSPITAL_COMMUNITY): Payer: Self-pay

## 2023-06-29 ENCOUNTER — Other Ambulatory Visit (HOSPITAL_COMMUNITY): Payer: Self-pay

## 2023-06-30 ENCOUNTER — Other Ambulatory Visit (HOSPITAL_COMMUNITY): Payer: Self-pay

## 2023-06-30 MED ORDER — OZEMPIC (2 MG/DOSE) 8 MG/3ML ~~LOC~~ SOPN
2.0000 mg | PEN_INJECTOR | SUBCUTANEOUS | 0 refills | Status: DC
Start: 2023-06-30 — End: 2023-09-24
  Filled 2023-06-30: qty 9, 84d supply, fill #0

## 2023-07-08 ENCOUNTER — Other Ambulatory Visit: Payer: Self-pay

## 2023-07-10 ENCOUNTER — Other Ambulatory Visit: Payer: Self-pay

## 2023-07-14 DIAGNOSIS — Z5181 Encounter for therapeutic drug level monitoring: Secondary | ICD-10-CM | POA: Diagnosis not present

## 2023-07-15 ENCOUNTER — Other Ambulatory Visit: Payer: Self-pay | Admitting: Pharmacy Technician

## 2023-07-15 ENCOUNTER — Other Ambulatory Visit: Payer: Self-pay

## 2023-07-15 NOTE — Progress Notes (Signed)
 Specialty Pharmacy Refill Coordination Note  Priscilla Webb is a 58 y.o. female contacted today regarding refills of specialty medication(s) Secukinumab (Cosentyx [name])   Patient requested [name] at [facility] at [place] date: 07/22/23   Medication will be filled on 07/21/23.

## 2023-07-21 ENCOUNTER — Other Ambulatory Visit: Payer: Self-pay

## 2023-08-10 ENCOUNTER — Other Ambulatory Visit: Payer: Self-pay

## 2023-08-10 NOTE — Progress Notes (Signed)
 Specialty Pharmacy Ongoing Clinical Assessment Note  Priscilla Webb is a 58 y.o. female who is being followed by the specialty pharmacy service for RxSp Hidradenitis Suppurativa   Patient's specialty medication(s) reviewed today: Secukinumab  (Cosentyx  [employer])   Missed doses in the last 4 weeks: 0   Patient/Caregiver did not have any additional questions or concerns.   Therapeutic benefit summary: Patient is achieving benefit   Adverse events/side effects summary: No adverse events/side effects   Patient's therapy is appropriate to: Continue    Goals Addressed             This Visit's Progress    Reduce signs and symptoms   On track    Patient is on track. Patient will maintain adherence         Follow up:  6 months  Margot Oriordan M Aniaya Bacha Specialty Pharmacist

## 2023-08-10 NOTE — Progress Notes (Signed)
 Specialty Pharmacy Refill Coordination Note  Priscilla Webb is a 59 y.o. female contacted today regarding refills of specialty medication(s) Secukinumab  (Cosentyx  [name])   Patient requested Randye Treichler Dk at [facility] at [place] date: 08/20/23   Medication will be filled on 08/19/23.

## 2023-08-11 ENCOUNTER — Other Ambulatory Visit (HOSPITAL_COMMUNITY): Payer: Self-pay

## 2023-08-17 ENCOUNTER — Other Ambulatory Visit (HOSPITAL_COMMUNITY): Payer: Self-pay

## 2023-08-17 ENCOUNTER — Other Ambulatory Visit: Payer: Self-pay

## 2023-08-19 ENCOUNTER — Other Ambulatory Visit: Payer: Self-pay

## 2023-08-21 ENCOUNTER — Other Ambulatory Visit (HOSPITAL_COMMUNITY): Payer: Self-pay

## 2023-09-08 ENCOUNTER — Other Ambulatory Visit: Payer: Self-pay

## 2023-09-08 NOTE — Progress Notes (Signed)
 Specialty Pharmacy Refill Coordination Note  Priscilla Webb is a 58 y.o. female contacted today regarding refills of specialty medication(s) Secukinumab  (Cosentyx  [name])   Patient requested [name] at [facility] at [place] date: 09/17/23   Medication will be filled on 09/16/23.   This fill date is pending response to refill request from provider. Patient is aware and if they have not received fill by intended date they must follow up with pharmacy.

## 2023-09-10 ENCOUNTER — Other Ambulatory Visit: Payer: Self-pay | Admitting: Pharmacist

## 2023-09-10 ENCOUNTER — Other Ambulatory Visit: Payer: Self-pay

## 2023-09-10 ENCOUNTER — Other Ambulatory Visit (HOSPITAL_COMMUNITY): Payer: Self-pay

## 2023-09-10 MED ORDER — COSENTYX UNOREADY 300 MG/2ML ~~LOC~~ SOAJ: SUBCUTANEOUS | 2 refills | Status: DC

## 2023-09-10 NOTE — Progress Notes (Signed)
 Called patient to schedule an appointment for the [facility] [employer] Specialty Medication Clinic. I was unable to reach the patient so I left a HIPAA-compliant message requesting that the patient return my call.   Butch Penny, PharmD, Patsy [place], CPP Clinical Pharmacist [facility] & [facility] [phone]

## 2023-09-22 ENCOUNTER — Other Ambulatory Visit (HOSPITAL_COMMUNITY): Payer: Self-pay

## 2023-09-22 ENCOUNTER — Ambulatory Visit: Attending: Family Medicine | Admitting: Pharmacist

## 2023-09-22 ENCOUNTER — Encounter: Payer: Self-pay | Admitting: Pharmacist

## 2023-09-22 ENCOUNTER — Other Ambulatory Visit: Payer: Self-pay

## 2023-09-22 DIAGNOSIS — L732 Hidradenitis suppurativa: Secondary | ICD-10-CM

## 2023-09-22 DIAGNOSIS — Z7189 Other specified counseling: Secondary | ICD-10-CM

## 2023-09-22 MED ORDER — COSENTYX UNOREADY 300 MG/2ML ~~LOC~~ SOAJ
SUBCUTANEOUS | 2 refills | Status: DC
  Filled 2023-09-22: qty 4, 28d supply, fill #0
  Filled 2023-10-12 – 2023-10-13 (×2): qty 4, 28d supply, fill #1
  Filled 2023-11-12: qty 4, 28d supply, fill #2

## 2023-09-22 NOTE — Progress Notes (Signed)
   S: Patient presents today for review of their specialty medication.   Patient is currently taking Cosentyx  (secukinumab ) for HS. Patient is managed by [facility] for this.   Dosing: HS: SubQ: 300 mg once weekly at weeks 0, 1, 2, 3, and 4 followed by 300 mg every 4 weeks. Some patients may only require 300 mg once every 2 weeks.  Adherence: confirms  Efficacy: reports good results so far with the medication.   Current adverse effects: S/sx of infection: none GI upset: none Headache: none S/sx of hypersensitivity: none  O:     Lab Results  Component Value Date   WBC 7.3 01/30/2014   HGB 15.0 02/02/2014   HCT 42.3 01/30/2014   MCV 92.0 01/30/2014   PLT 252 01/30/2014      Chemistry      Component Value Date/Time   NA 138 01/30/2014 0830   K 4.2 01/30/2014 0830   CL 97 01/30/2014 0830   CO2 26 01/30/2014 0830   BUN 12 01/30/2014 0830   CREATININE 0.59 01/30/2014 0830      Component Value Date/Time   CALCIUM  8.9 01/30/2014 0830   ALKPHOS 76 11/29/2011 0250   AST 19 11/29/2011 0250   ALT 26 11/29/2011 0250   BILITOT 0.3 11/29/2011 0250       A/P: 1. Medication review: Patient is currently on Cosentyx  for HS and is tolerating it well. Reviewed the medication with the patient, including the following: Cosentyx  is a monoclonal antibody used in the treatment of ankylosing spondylitis, psoriasis, and psoriatic arthritis. The injection is subq and the medication should be allowed to reach room temp prior to injecting. Injection sites should be rotated. Possible adverse effects include headaches, GI upset, increased risk of infection and hypersensitivity reactions. No recommendations for any changes at this time.   Marene Shape, PharmD, Becky Bowels, CPP Clinical Pharmacist [facility] [phone]

## 2023-09-22 NOTE — Progress Notes (Signed)
 Called & spoke with patient. Re-write has been sent in. PPU 6/3 at [facility]

## 2023-09-23 ENCOUNTER — Other Ambulatory Visit: Payer: Self-pay

## 2023-09-23 ENCOUNTER — Other Ambulatory Visit (HOSPITAL_COMMUNITY): Payer: Self-pay

## 2023-09-24 ENCOUNTER — Other Ambulatory Visit (HOSPITAL_COMMUNITY): Payer: Self-pay

## 2023-09-24 MED ORDER — OZEMPIC (2 MG/DOSE) 8 MG/3ML ~~LOC~~ SOPN
2.0000 mg | PEN_INJECTOR | SUBCUTANEOUS | 1 refills | Status: AC
  Filled 2023-09-24: qty 9, 84d supply, fill #0
  Filled 2024-03-11 – 2024-05-05 (×2): qty 3, 28d supply, fill #1

## 2023-09-29 DIAGNOSIS — E559 Vitamin D deficiency, unspecified: Secondary | ICD-10-CM | POA: Diagnosis not present

## 2023-09-29 DIAGNOSIS — E119 Type 2 diabetes mellitus without complications: Secondary | ICD-10-CM | POA: Diagnosis not present

## 2023-09-29 DIAGNOSIS — Z6841 Body Mass Index (BMI) 40.0 and over, adult: Secondary | ICD-10-CM | POA: Diagnosis not present

## 2023-09-29 DIAGNOSIS — E7800 Pure hypercholesterolemia, unspecified: Secondary | ICD-10-CM | POA: Diagnosis not present

## 2023-10-12 ENCOUNTER — Other Ambulatory Visit: Payer: Self-pay

## 2023-10-13 ENCOUNTER — Other Ambulatory Visit: Payer: Self-pay

## 2023-10-15 ENCOUNTER — Other Ambulatory Visit: Payer: Self-pay

## 2023-10-15 NOTE — Progress Notes (Signed)
 Specialty Pharmacy Refill Coordination Note  Priscilla Webb is a 58 y.o. female contacted today regarding refills of specialty medication(s) Secukinumab  (Cosentyx  [name])   Patient requested [name] at [facility] at [place] date: 10/19/23   Medication will be filled on 10/16/23.

## 2023-10-20 ENCOUNTER — Other Ambulatory Visit: Payer: Self-pay

## 2023-10-20 ENCOUNTER — Other Ambulatory Visit (HOSPITAL_COMMUNITY): Payer: Self-pay

## 2023-11-06 ENCOUNTER — Other Ambulatory Visit (HOSPITAL_COMMUNITY): Payer: Self-pay

## 2023-11-09 ENCOUNTER — Other Ambulatory Visit: Payer: Self-pay

## 2023-11-09 ENCOUNTER — Other Ambulatory Visit (HOSPITAL_COMMUNITY): Payer: Self-pay

## 2023-11-09 MED ORDER — ATORVASTATIN CALCIUM 20 MG PO TABS
20.0000 mg | ORAL_TABLET | Freq: Every day | ORAL | 3 refills | Status: AC
  Filled 2023-11-09: qty 90, 90d supply, fill #0
  Filled 2024-03-24: qty 90, 90d supply, fill #1

## 2023-11-09 MED ORDER — TRIAMCINOLONE ACETONIDE 0.1 % EX CREA
TOPICAL_CREAM | CUTANEOUS | 0 refills | Status: AC
  Filled 2023-11-09: qty 454, 30d supply, fill #0

## 2023-11-10 ENCOUNTER — Other Ambulatory Visit: Payer: Self-pay

## 2023-11-12 ENCOUNTER — Other Ambulatory Visit: Payer: Self-pay

## 2023-11-16 ENCOUNTER — Other Ambulatory Visit (HOSPITAL_COMMUNITY): Payer: Self-pay

## 2023-11-16 ENCOUNTER — Other Ambulatory Visit: Payer: Self-pay

## 2023-11-17 ENCOUNTER — Other Ambulatory Visit (HOSPITAL_COMMUNITY): Payer: Self-pay

## 2023-11-17 ENCOUNTER — Other Ambulatory Visit: Payer: Self-pay

## 2023-11-17 DIAGNOSIS — L732 Hidradenitis suppurativa: Secondary | ICD-10-CM | POA: Diagnosis not present

## 2023-11-17 DIAGNOSIS — Z5181 Encounter for therapeutic drug level monitoring: Secondary | ICD-10-CM | POA: Diagnosis not present

## 2023-11-17 MED ORDER — SULFAMETHOXAZOLE-TRIMETHOPRIM 800-160 MG PO TABS
1.0000 | ORAL_TABLET | Freq: Two times a day (BID) | ORAL | 4 refills | Status: AC
  Filled 2023-11-17: qty 28, 14d supply, fill #0
  Filled 2023-12-29: qty 28, 14d supply, fill #1

## 2023-11-23 ENCOUNTER — Other Ambulatory Visit: Payer: Self-pay

## 2023-12-02 ENCOUNTER — Other Ambulatory Visit (HOSPITAL_COMMUNITY): Payer: Self-pay

## 2023-12-02 ENCOUNTER — Other Ambulatory Visit: Payer: Self-pay | Admitting: Pharmacist

## 2023-12-02 ENCOUNTER — Other Ambulatory Visit: Payer: Self-pay

## 2023-12-02 ENCOUNTER — Ambulatory Visit: Attending: Family Medicine | Admitting: Pharmacist

## 2023-12-02 DIAGNOSIS — Z7189 Other specified counseling: Secondary | ICD-10-CM

## 2023-12-02 MED ORDER — BIMZELX 320 MG/2ML ~~LOC~~ SOAJ
SUBCUTANEOUS | 4 refills | Status: AC
  Filled 2023-12-02: qty 4, fill #0
  Filled 2023-12-04: qty 4, 28d supply, fill #0
  Filled 2023-12-31: qty 4, 28d supply, fill #1
  Filled 2024-01-29: qty 4, 28d supply, fill #2
  Filled 2024-02-24 – 2024-03-02 (×2): qty 4, 28d supply, fill #3
  Filled 2024-03-28: qty 2, 28d supply, fill #4
  Filled 2024-04-01: qty 4, 28d supply, fill #4

## 2023-12-02 MED ORDER — BIMZELX 320 MG/2ML ~~LOC~~ SOAJ: SUBCUTANEOUS | 2 refills | Status: DC

## 2023-12-02 MED ORDER — BIMZELX 320 MG/2ML ~~LOC~~ SOAJ: SUBCUTANEOUS | 4 refills | Status: DC

## 2023-12-02 MED ORDER — BIMZELX 320 MG/2ML ~~LOC~~ SOAJ
SUBCUTANEOUS | 2 refills | Status: AC
  Filled 2023-12-02 (×2): qty 2, fill #0
  Filled 2024-04-28 – 2024-05-03 (×2): qty 2, 28d supply, fill #0

## 2023-12-02 NOTE — Progress Notes (Signed)
   S: Patient presents today for review of their specialty medication.   Patient is about to start taking Bimzelx  for HS. Patient is managed by Dr. Marciano for this.   Dosing: inject 320 mg subcutaneously once every 2 weeks for the first 9 doses (week 0, 2, 4, 6, 8, 10, 12, 14, and 16) followed by once every 4 weeks thereafter.   Adherence: has not started   Efficacy: has taken previously with good results.  Monitoring:  -S/Sx of inflammatory bowel disease: none when taking previously  -Liver function tests: nl when taking previously  -TB screening: completed, negative -Psychiatric effects: none when taking previously   Current adverse effects: -None to report at this time.  -Injection site reaction: none when taking previously  -GI side effects: none when taking previously  -Oral thrush: none when taking previously -Musculoskeletal pain: none when taking previously -VVC/genital mycotic infection/UTI symptoms: none when taking previously  -URI symptoms: none when taking previously -HA: none when taking previously   O:     Lab Results  Component Value Date   WBC 7.3 01/30/2014   HGB 15.0 02/02/2014   HCT 42.3 01/30/2014   MCV 92.0 01/30/2014   PLT 252 01/30/2014      Chemistry      Component Value Date/Time   NA 138 01/30/2014 0830   K 4.2 01/30/2014 0830   CL 97 01/30/2014 0830   CO2 26 01/30/2014 0830   BUN 12 01/30/2014 0830   CREATININE 0.59 01/30/2014 0830      Component Value Date/Time   CALCIUM  8.9 01/30/2014 0830   ALKPHOS 76 11/29/2011 0250   AST 19 11/29/2011 0250   ALT 26 11/29/2011 0250   BILITOT 0.3 11/29/2011 0250       A/P: 1. Medication review: patient about to restart Bimzelx  for HS. She has taken this previously with good results. Tolerated it well. Reviewed the medication including the following: bimekizumab  is a a humanized antibody that binds to IL17 and prevents the formation of the IL17 receptor complex. This action is  thought to decrease inflammation associated with HS. It is supplied as an autoinjector pen or PFS and should be stored in the refrigerator. The medication should be removed ~30-45 minutes prior to injection to allow to reach room temperature. The solution should not be shaken. Bimzelx  should be given as a subcutaneous injection in the abdomen, thigh, or upper arm. The most common adverse reactions include infection site reaction, upper respiratory infection, HA, cough, and/or muscle pain. More rare side effects include elevation in liver enzyme tests, suicidal thoughts, symptoms of inflammatory bowel disease, candidiasis and UTI. These symptoms should be reported to your specialist. Patient has no further questions or concerns at this time. No recommendation for any changes.   Herlene Fleeta Morris, PharmD, JAQUELINE, CPP Clinical Pharmacist [facility] & [facility] [phone]

## 2023-12-03 ENCOUNTER — Other Ambulatory Visit: Payer: Self-pay

## 2023-12-04 ENCOUNTER — Other Ambulatory Visit: Payer: Self-pay

## 2023-12-04 ENCOUNTER — Other Ambulatory Visit (HOSPITAL_COMMUNITY): Payer: Self-pay

## 2023-12-04 NOTE — Progress Notes (Signed)
 Specialty Pharmacy Initial Fill Coordination Note  Priscilla Webb is a 58 y.o. female contacted today regarding initial fill of specialty medication(s) Bimekizumab -bkzx (Bimzelx )   Patient requested [name] at [facility] at [place] date: 12/07/23   Medication will be filled on 8/18.   Patient is aware of $0 copayment.

## 2023-12-29 ENCOUNTER — Other Ambulatory Visit (HOSPITAL_COMMUNITY): Payer: Self-pay

## 2023-12-31 ENCOUNTER — Other Ambulatory Visit: Payer: Self-pay

## 2023-12-31 ENCOUNTER — Other Ambulatory Visit: Payer: Self-pay | Admitting: Pharmacy Technician

## 2023-12-31 NOTE — Progress Notes (Signed)
 Specialty Pharmacy Refill Coordination Note  Priscilla Webb is a 58 y.o. female contacted today regarding refills of specialty medication(s) Bimekizumab -bkzx (Bimzelx )   Patient requested Pickup at [facility] at [place] date: 01/05/24   Medication will be filled on 01/04/24.

## 2024-01-04 ENCOUNTER — Other Ambulatory Visit: Payer: Self-pay

## 2024-01-06 ENCOUNTER — Other Ambulatory Visit (HOSPITAL_COMMUNITY): Payer: Self-pay

## 2024-01-23 ENCOUNTER — Other Ambulatory Visit (HOSPITAL_COMMUNITY): Payer: Self-pay

## 2024-01-23 DIAGNOSIS — R0981 Nasal congestion: Secondary | ICD-10-CM | POA: Diagnosis not present

## 2024-01-23 DIAGNOSIS — U071 COVID-19: Secondary | ICD-10-CM | POA: Diagnosis not present

## 2024-01-23 DIAGNOSIS — R051 Acute cough: Secondary | ICD-10-CM | POA: Diagnosis not present

## 2024-01-23 MED ORDER — NIRMATRELVIR&RITONAVIR 300/100 20 X 150 MG & 10 X 100MG PO TBPK
3.0000 | ORAL_TABLET | Freq: Two times a day (BID) | ORAL | 0 refills | Status: AC
  Filled 2024-01-23: qty 30, 5d supply, fill #0

## 2024-01-23 MED ORDER — IPRATROPIUM BROMIDE 0.06 % NA SOLN
2.0000 | Freq: Three times a day (TID) | NASAL | 0 refills | Status: AC
  Filled 2024-01-23: qty 15, 25d supply, fill #0

## 2024-01-27 ENCOUNTER — Other Ambulatory Visit: Payer: Self-pay

## 2024-01-29 ENCOUNTER — Other Ambulatory Visit: Payer: Self-pay

## 2024-01-29 NOTE — Progress Notes (Signed)
 Specialty Pharmacy Refill Coordination Note  Priscilla Webb is a 58 y.o. female contacted today regarding refills of specialty medication(s) Bimekizumab -bkzx (Bimzelx )   Patient requested [name] at [facility] at [place] date: 02/03/24   Medication will be filled on 02/02/24.

## 2024-02-01 ENCOUNTER — Other Ambulatory Visit: Payer: Self-pay

## 2024-02-03 ENCOUNTER — Other Ambulatory Visit (HOSPITAL_COMMUNITY): Payer: Self-pay

## 2024-02-24 ENCOUNTER — Other Ambulatory Visit: Payer: Self-pay

## 2024-02-26 ENCOUNTER — Other Ambulatory Visit: Payer: Self-pay

## 2024-02-26 NOTE — Progress Notes (Signed)
 Specialty Pharmacy Refill Coordination Note  Priscilla Webb is a 58 y.o. female contacted today regarding refills of specialty medication(s) Bimekizumab -bkzx (Bimzelx )   Patient requested Pickup at [facility] at [place] date: 03/03/24   Medication will be filled on: 03/02/24

## 2024-03-02 ENCOUNTER — Other Ambulatory Visit: Payer: Self-pay

## 2024-03-11 ENCOUNTER — Other Ambulatory Visit (HOSPITAL_COMMUNITY): Payer: Self-pay

## 2024-03-14 ENCOUNTER — Other Ambulatory Visit (HOSPITAL_COMMUNITY): Payer: Self-pay

## 2024-03-14 ENCOUNTER — Other Ambulatory Visit: Payer: Self-pay

## 2024-03-14 MED ORDER — OZEMPIC (2 MG/DOSE) 8 MG/3ML ~~LOC~~ SOPN
2.0000 mg | PEN_INJECTOR | SUBCUTANEOUS | 0 refills | Status: AC
  Filled 2024-03-14: qty 3, 28d supply, fill #0
  Filled 2024-04-07: qty 3, 28d supply, fill #1

## 2024-03-24 ENCOUNTER — Other Ambulatory Visit (HOSPITAL_COMMUNITY): Payer: Self-pay

## 2024-03-28 ENCOUNTER — Other Ambulatory Visit: Payer: Self-pay

## 2024-03-28 ENCOUNTER — Other Ambulatory Visit (HOSPITAL_COMMUNITY): Payer: Self-pay

## 2024-03-30 ENCOUNTER — Other Ambulatory Visit: Payer: Self-pay

## 2024-04-01 ENCOUNTER — Other Ambulatory Visit: Payer: Self-pay

## 2024-04-01 ENCOUNTER — Other Ambulatory Visit (HOSPITAL_COMMUNITY): Payer: Self-pay

## 2024-04-01 NOTE — Progress Notes (Signed)
 Specialty Pharmacy Refill Coordination Note  Priscilla Webb is a 58 y.o. female contacted today regarding refills of specialty medication(s) Bimekizumab -bkzx (Bimzelx )   Patient requested [name] at [facility] at [place] date: 04/04/24   Medication will be filled on: 04/04/24

## 2024-04-07 ENCOUNTER — Other Ambulatory Visit (HOSPITAL_COMMUNITY): Payer: Self-pay

## 2024-04-08 ENCOUNTER — Other Ambulatory Visit: Payer: Self-pay

## 2024-04-13 ENCOUNTER — Other Ambulatory Visit: Payer: Self-pay

## 2024-04-13 ENCOUNTER — Other Ambulatory Visit (HOSPITAL_COMMUNITY): Payer: Self-pay

## 2024-04-28 ENCOUNTER — Other Ambulatory Visit: Payer: Self-pay

## 2024-04-28 ENCOUNTER — Telehealth: Payer: Self-pay

## 2024-04-28 ENCOUNTER — Other Ambulatory Visit (HOSPITAL_COMMUNITY): Payer: Self-pay

## 2024-04-28 NOTE — Telephone Encounter (Signed)
 Pharmacy Patient Advocate Encounter   Received notification from Pt Calls Messages that prior authorization for Bimzelx  is required/requested.   Insurance verification completed.   The patient is insured through [facility].   Per test claim: PA required; PA submitted to above mentioned insurance via Latent Key/confirmation #/EOC [facility] Status is pending

## 2024-04-28 NOTE — Progress Notes (Signed)
 PA pending

## 2024-04-29 ENCOUNTER — Other Ambulatory Visit: Payer: Self-pay

## 2024-04-29 NOTE — Progress Notes (Signed)
 PA approved. Test claim documented in another encounter. Copay is $5 with copay card. Patient has [id number], so we can offset copay with cone $0.

## 2024-04-29 NOTE — Telephone Encounter (Signed)
 Pharmacy Patient Advocate Encounter  Received notification from [employer] that Prior Authorization for Bimzelx  has been APPROVED from 04/28/24 to 04/27/25. Ran test claim, Copay is $5. This test claim was processed through [facility]- copay amounts may vary at other pharmacies due to pharmacy/plan contracts, or as the patient moves through the different stages of their insurance plan.   PA #/Case ID/Reference #: [id number]

## 2024-05-03 ENCOUNTER — Other Ambulatory Visit: Payer: Self-pay

## 2024-05-05 ENCOUNTER — Other Ambulatory Visit: Payer: Self-pay

## 2024-05-05 ENCOUNTER — Other Ambulatory Visit (HOSPITAL_COMMUNITY): Payer: Self-pay

## 2024-05-05 NOTE — Progress Notes (Signed)
 Specialty Pharmacy Refill Coordination Note  Priscilla Webb is a 59 y.o. female contacted today regarding refills of specialty medication(s) Bimekizumab -bkzx (Bimzelx )   Patient requested Pickup at [facility] at [place] date: 05/17/24   Medication will be filled on: 05/16/24

## 2024-05-10 ENCOUNTER — Other Ambulatory Visit (HOSPITAL_COMMUNITY): Payer: Self-pay

## 2024-05-10 ENCOUNTER — Other Ambulatory Visit: Payer: Self-pay

## 2024-05-11 ENCOUNTER — Other Ambulatory Visit: Payer: Self-pay

## 2024-05-16 ENCOUNTER — Other Ambulatory Visit: Payer: Self-pay
